# Patient Record
Sex: Female | Born: 1955
Health system: Southern US, Community
[De-identification: ages and names within clinical notes are randomized; demographics above are authoritative.]

## PROBLEM LIST (undated history)

## (undated) DIAGNOSIS — I10 Essential (primary) hypertension: Secondary | ICD-10-CM

## (undated) DIAGNOSIS — E119 Type 2 diabetes mellitus without complications: Secondary | ICD-10-CM

## (undated) DIAGNOSIS — E785 Hyperlipidemia, unspecified: Secondary | ICD-10-CM

## (undated) HISTORY — DX: Hyperlipidemia, unspecified: E78.5

---

## 2019-04-15 DIAGNOSIS — I2510 Atherosclerotic heart disease of native coronary artery without angina pectoris: Secondary | ICD-10-CM

## 2019-04-15 HISTORY — DX: Atherosclerotic heart disease of native coronary artery without angina pectoris: I25.10

## 2019-06-24 ENCOUNTER — Emergency Department (HOSPITAL_BASED_OUTPATIENT_CLINIC_OR_DEPARTMENT_OTHER): Payer: Medicare HMO

## 2019-06-24 ENCOUNTER — Other Ambulatory Visit: Payer: Self-pay

## 2019-06-24 ENCOUNTER — Inpatient Hospital Stay (HOSPITAL_BASED_OUTPATIENT_CLINIC_OR_DEPARTMENT_OTHER)
Admission: EM | Admit: 2019-06-24 | Discharge: 2019-06-28 | DRG: 246 | Disposition: A | Discharge status: Home / Self Care | Payer: Medicare HMO | Attending: Cardiology | Admitting: Cardiology

## 2019-06-24 ENCOUNTER — Encounter (HOSPITAL_BASED_OUTPATIENT_CLINIC_OR_DEPARTMENT_OTHER): Payer: Self-pay

## 2019-06-24 DIAGNOSIS — Z79899 Other long term (current) drug therapy: Secondary | ICD-10-CM | POA: Diagnosis not present

## 2019-06-24 DIAGNOSIS — Z823 Family history of stroke: Secondary | ICD-10-CM

## 2019-06-24 DIAGNOSIS — I255 Ischemic cardiomyopathy: Secondary | ICD-10-CM | POA: Diagnosis not present

## 2019-06-24 DIAGNOSIS — E785 Hyperlipidemia, unspecified: Secondary | ICD-10-CM | POA: Diagnosis present

## 2019-06-24 DIAGNOSIS — E119 Type 2 diabetes mellitus without complications: Secondary | ICD-10-CM | POA: Diagnosis not present

## 2019-06-24 DIAGNOSIS — Z7982 Long term (current) use of aspirin: Secondary | ICD-10-CM

## 2019-06-24 DIAGNOSIS — I214 Non-ST elevation (NSTEMI) myocardial infarction: Secondary | ICD-10-CM | POA: Diagnosis present

## 2019-06-24 DIAGNOSIS — I5041 Acute combined systolic (congestive) and diastolic (congestive) heart failure: Secondary | ICD-10-CM | POA: Diagnosis present

## 2019-06-24 DIAGNOSIS — I25110 Atherosclerotic heart disease of native coronary artery with unstable angina pectoris: Secondary | ICD-10-CM | POA: Diagnosis present

## 2019-06-24 DIAGNOSIS — Z20822 Contact with and (suspected) exposure to covid-19: Secondary | ICD-10-CM | POA: Diagnosis present

## 2019-06-24 DIAGNOSIS — I2510 Atherosclerotic heart disease of native coronary artery without angina pectoris: Secondary | ICD-10-CM

## 2019-06-24 DIAGNOSIS — I340 Nonrheumatic mitral (valve) insufficiency: Secondary | ICD-10-CM | POA: Diagnosis not present

## 2019-06-24 DIAGNOSIS — R252 Cramp and spasm: Secondary | ICD-10-CM | POA: Diagnosis present

## 2019-06-24 DIAGNOSIS — Z7984 Long term (current) use of oral hypoglycemic drugs: Secondary | ICD-10-CM | POA: Diagnosis not present

## 2019-06-24 DIAGNOSIS — I10 Essential (primary) hypertension: Secondary | ICD-10-CM | POA: Diagnosis not present

## 2019-06-24 DIAGNOSIS — I213 ST elevation (STEMI) myocardial infarction of unspecified site: Secondary | ICD-10-CM | POA: Diagnosis present

## 2019-06-24 DIAGNOSIS — I110 Hypertensive heart disease with heart failure: Secondary | ICD-10-CM | POA: Diagnosis present

## 2019-06-24 DIAGNOSIS — Z955 Presence of coronary angioplasty implant and graft: Secondary | ICD-10-CM

## 2019-06-24 DIAGNOSIS — E1165 Type 2 diabetes mellitus with hyperglycemia: Secondary | ICD-10-CM | POA: Diagnosis present

## 2019-06-24 DIAGNOSIS — E782 Mixed hyperlipidemia: Secondary | ICD-10-CM | POA: Diagnosis not present

## 2019-06-24 HISTORY — DX: Type 2 diabetes mellitus without complications: E11.9

## 2019-06-24 HISTORY — DX: Essential (primary) hypertension: I10

## 2019-06-24 LAB — CBC
HCT: 42.2 % (ref 36.0–46.0)
Hemoglobin: 13.6 g/dL (ref 12.0–15.0)
MCH: 28.3 pg (ref 26.0–34.0)
MCHC: 32.2 g/dL (ref 30.0–36.0)
MCV: 87.9 fL (ref 80.0–100.0)
Platelets: 314 10*3/uL (ref 150–400)
RBC: 4.8 MIL/uL (ref 3.87–5.11)
RDW: 12.6 % (ref 11.5–15.5)
WBC: 13.1 10*3/uL — ABNORMAL HIGH (ref 4.0–10.5)
nRBC: 0 % (ref 0.0–0.2)

## 2019-06-24 LAB — PROTIME-INR
INR: 0.9 (ref 0.8–1.2)
Prothrombin Time: 12.5 seconds (ref 11.4–15.2)

## 2019-06-24 LAB — BASIC METABOLIC PANEL
Anion gap: 12 (ref 5–15)
BUN: 13 mg/dL (ref 8–23)
CO2: 26 mmol/L (ref 22–32)
Calcium: 10.1 mg/dL (ref 8.9–10.3)
Chloride: 99 mmol/L (ref 98–111)
Creatinine, Ser: 0.79 mg/dL (ref 0.44–1.00)
GFR calc Af Amer: >60 mL/min (ref >60)
GFR calc non Af Amer: >60 mL/min (ref >60)
Glucose, Bld: 198 mg/dL — ABNORMAL HIGH (ref 70–99)
Potassium: 4.5 mmol/L (ref 3.5–5.1)
Sodium: 137 mmol/L (ref 135–145)

## 2019-06-24 LAB — TROPONIN I (HIGH SENSITIVITY)
Troponin I (High Sensitivity): 391 ng/L (ref <18)
Troponin I (High Sensitivity): 659 ng/L (ref <18)

## 2019-06-24 LAB — APTT: aPTT: 23 seconds — ABNORMAL LOW (ref 24–36)

## 2019-06-24 LAB — RESPIRATORY PANEL BY RT PCR (FLU A&B, COVID)
Influenza A by PCR: NEGATIVE
Influenza B by PCR: NEGATIVE
SARS Coronavirus 2 by RT PCR: NEGATIVE

## 2019-06-24 LAB — GLUCOSE, CAPILLARY
Glucose-Capillary: 167 mg/dL — ABNORMAL HIGH (ref 70–99)
Glucose-Capillary: 184 mg/dL — ABNORMAL HIGH (ref 70–99)

## 2019-06-24 MED ORDER — HEPARIN BOLUS VIA INFUSION
2900.0000 [IU] | Freq: Once | INTRAVENOUS | Status: AC
  Administered 2019-06-24: 2900 [IU] via INTRAVENOUS

## 2019-06-24 MED ORDER — SODIUM CHLORIDE 0.9% FLUSH
3.0000 mL | Freq: Once | INTRAVENOUS | Status: DC
  Filled 2019-06-24: qty 3

## 2019-06-24 MED ORDER — NITROGLYCERIN 0.4 MG SL SUBL: 0.4000 mg | SUBLINGUAL_TABLET | SUBLINGUAL | Status: DC | PRN

## 2019-06-24 MED ORDER — ASPIRIN EC 81 MG PO TBEC
81.0000 mg | DELAYED_RELEASE_TABLET | Freq: Every day | ORAL | Status: DC
  Administered 2019-06-25 – 2019-06-28 (×4): 81 mg via ORAL
  Filled 2019-06-24 (×4): qty 1

## 2019-06-24 MED ORDER — HEPARIN (PORCINE) 25000 UT/250ML-% IV SOLN
600.0000 [IU]/h | INTRAVENOUS | Status: DC
  Administered 2019-06-24 – 2019-06-26 (×2): 600 [IU]/h via INTRAVENOUS
  Filled 2019-06-24 (×2): qty 250

## 2019-06-24 MED ORDER — METOPROLOL TARTRATE 25 MG PO TABS
25.0000 mg | ORAL_TABLET | Freq: Two times a day (BID) | ORAL | Status: DC
  Administered 2019-06-24 – 2019-06-28 (×8): 25 mg via ORAL
  Filled 2019-06-24 (×8): qty 1

## 2019-06-24 MED ORDER — ONDANSETRON HCL 4 MG/2ML IJ SOLN: 4.0000 mg | Freq: Four times a day (QID) | INTRAMUSCULAR | Status: DC | PRN

## 2019-06-24 MED ORDER — IRBESARTAN 150 MG PO TABS
150.0000 mg | ORAL_TABLET | Freq: Every day | ORAL | Status: DC
  Administered 2019-06-25 – 2019-06-28 (×4): 150 mg via ORAL
  Filled 2019-06-24 (×4): qty 1

## 2019-06-24 MED ORDER — ATORVASTATIN CALCIUM 80 MG PO TABS
80.0000 mg | ORAL_TABLET | Freq: Every day | ORAL | Status: DC
  Administered 2019-06-24 – 2019-06-26 (×3): 80 mg via ORAL
  Filled 2019-06-24 (×2): qty 1
  Filled 2019-06-24: qty 2

## 2019-06-24 MED ORDER — ASPIRIN EC 325 MG PO TBEC
325.0000 mg | DELAYED_RELEASE_TABLET | Freq: Once | ORAL | Status: AC
  Administered 2019-06-24: 17:00:00 325 mg via ORAL
  Filled 2019-06-24: qty 1

## 2019-06-24 MED ORDER — INSULIN ASPART 100 UNIT/ML ~~LOC~~ SOLN
0.0000 [IU] | Freq: Three times a day (TID) | SUBCUTANEOUS | Status: DC
  Administered 2019-06-25: 18:00:00 2 [IU] via SUBCUTANEOUS
  Administered 2019-06-25: 14:00:00 7 [IU] via SUBCUTANEOUS
  Administered 2019-06-25: 10:00:00 1 [IU] via SUBCUTANEOUS
  Administered 2019-06-26: 17:00:00 5 [IU] via SUBCUTANEOUS
  Administered 2019-06-26 (×2): 2 [IU] via SUBCUTANEOUS

## 2019-06-24 MED ORDER — ACETAMINOPHEN 325 MG PO TABS: 650.0000 mg | ORAL_TABLET | ORAL | Status: DC | PRN

## 2019-06-24 NOTE — ED Provider Notes (Signed)
[facility] EMERGENCY DEPARTMENT Provider Note   CSN: [id number] Arrival date & time: 06/24/19  1515     History Chief Complaint  Patient presents with  . Chest Pain    Holly Scott is a 64 y.o. female with history of hypertension, diabetes, presented to the ED with chest pain and nausea.  The patient speaks [place], but her daughter in law at the bedside is able to translate.  Her daughter in law teaches ACLS and has some medical knowledge.  She tells me that the patient was having chest pain and epigastric discomfort yesterday, though she did not complain about any family members.  Had a difficult time describing other than describe burning sensation in her chest.  This pain seemed to come and go.  It began around 11 AM yesterday, 1 to 2 hours after she had eaten breakfast.  It was not related to activity, and the patient had never had pain like that before.  Today she went to the dentist this morning for tooth cleaning.  She returned home, and complained to her daughter about 1 hour ago of chest pain and nausea.  She vomited once 1 hour ago and her chest pain has completely resolved.  She feels back to normal.  She has regular daily bowel movements, including today.  No diarhrea or constipation.  She got both covid vaccine doses, most recently 3 weeks ago.  She denies fevers, chills, cough, congestion or SOB recently.  No hx of abdominal surgery No hx of cardiac disease, MI, stents, angina, smoking, or family hx of cardiac disease.  HPI     Past Medical History:  Diagnosis Date  . Diabetes mellitus without complication (HCC)   . Hypertension     Patient Active Problem List   Diagnosis Date Noted  . NSTEMI (non-ST elevated myocardial infarction) (HCC) 06/24/2019    History reviewed. No pertinent surgical history.   OB History   No obstetric history on file.     No family history on file.  Social History   Tobacco Use  . Smoking status: Never  Smoker  . Smokeless tobacco: Never Used  Substance Use Topics  . Alcohol use: Never  . Drug use: Never    Home Medications Prior to Admission medications   Not on File    Allergies    Patient has no known allergies.  Review of Systems   Review of Systems  Constitutional: Negative for chills and fever.  Respiratory: Negative for cough and shortness of breath.   Cardiovascular: Positive for chest pain. Negative for palpitations.  Gastrointestinal: Positive for nausea and vomiting. Negative for abdominal pain and constipation.  Skin: Negative for pallor and rash.  Psychiatric/Behavioral: Negative for agitation and confusion.  All other systems reviewed and are negative.   Physical Exam Updated Vital Signs BP 128/71   Pulse 86   Temp 98 F (36.7 C) (Oral)   Resp 19   Ht 5' (1.524 m)   Wt 48.1 kg   SpO2 98%   BMI 20.70 kg/m   Physical Exam Vitals and nursing note reviewed.  Constitutional:      General: She is not in acute distress.    Appearance: She is well-developed.  HENT:     Head: Normocephalic and atraumatic.  Eyes:     Conjunctiva/sclera: Conjunctivae normal.  Cardiovascular:     Rate and Rhythm: Normal rate and regular rhythm.     Heart sounds: No murmur.  Pulmonary:     Effort: Pulmonary  effort is normal. No respiratory distress.     Breath sounds: Normal breath sounds.  Abdominal:     Palpations: Abdomen is soft.     Tenderness: There is no abdominal tenderness. There is no guarding or rebound. Negative signs include Murphy's sign and McBurney's sign.  Musculoskeletal:     Cervical back: Neck supple.  Skin:    General: Skin is warm and dry.  Neurological:     Mental Status: She is alert.  Psychiatric:        Mood and Affect: Mood normal.        Behavior: Behavior normal.     ED Results / Procedures / Treatments   Labs (all labs ordered are listed, but only abnormal results are displayed) Labs Reviewed  BASIC METABOLIC PANEL - Abnormal;  Notable for the following components:      Result Value   Glucose, Bld 198 (*)    All other components within normal limits  CBC - Abnormal; Notable for the following components:   WBC 13.1 (*)    All other components within normal limits  APTT - Abnormal; Notable for the following components:   aPTT 23 (*)    All other components within normal limits  TROPONIN I (HIGH SENSITIVITY) - Abnormal; Notable for the following components:   Troponin I (High Sensitivity) 391 (*)    All other components within normal limits  RESPIRATORY PANEL BY RT PCR (FLU A&B, COVID)  PROTIME-INR  TROPONIN I (HIGH SENSITIVITY)    EKG EKG Interpretation  Date/Time:  Friday June 24 2019 16:32:10 EST Ventricular Rate:  84 PR Interval:    QRS Duration: 67 QT Interval:  367 QTC Calculation: 434 R Axis:   62 Text Interpretation: Sinus rhythm Anteroseptal infarct, old No STEMI, improvement of ST elevation in V1 from arrival Confirmed by Octaviano Glow [phone]) on 06/24/2019 4:35:44 PM   Radiology DG Chest 2 View  Result Date: 06/24/2019 CLINICAL DATA:  Chest pain. Pain for 2 days. EXAM: CHEST - 2 VIEW COMPARISON:  None. FINDINGS: The cardiomediastinal contours are normal. The lungs are clear. Pulmonary vasculature is normal. No consolidation, pleural effusion, or pneumothorax. No acute osseous abnormalities are seen. IMPRESSION: Negative radiographs of the chest. Electronically Signed   By: Keith Rake M.D.   On: 06/24/2019 16:04    Procedures .Critical Care Performed by: Wyvonnia Dusky, MD Authorized by: Wyvonnia Dusky, MD   Critical care provider statement:    Critical care time (minutes):  52   Critical care was necessary to treat or prevent imminent or life-threatening deterioration of the following conditions:  Circulatory failure   Critical care was time spent personally by me on the following activities:  Discussions with consultants, evaluation of patient's response to treatment,  examination of patient, ordering and performing treatments and interventions, ordering and review of laboratory studies, ordering and review of radiographic studies, pulse oximetry, re-evaluation of patient's condition, obtaining history from patient or surrogate and review of old charts Comments:     NSTEMI requiring IV heparin, repeat ECG, discussion with consultant   (including critical care time)  Medications Ordered in ED Medications  sodium chloride flush (NS) 0.9 % injection 3 mL (3 mLs Intravenous Not Given 06/24/19 1542)  atorvastatin (LIPITOR) tablet 80 mg (80 mg Oral Given 06/24/19 1646)  heparin ADULT infusion 100 units/mL (25000 units/216mL sodium chloride 0.45%) (600 Units/hr Intravenous Transfusing/Transfer 06/24/19 1738)  aspirin EC tablet 325 mg (325 mg Oral Given 06/24/19 1648)  heparin bolus via  infusion 2,900 Units (2,900 Units Intravenous Bolus from Bag 06/24/19 1652)    ED Course  I have reviewed the triage vital signs and the nursing notes.  Pertinent labs & imaging results that were available during my care of the patient were reviewed by me and considered in my medical decision making (see chart for details).  64 year old female presented to the emergency department with ongoing epigastric pain and discomfort for the past 2 days, most recently experienced a flare approximately 1 hour prior to arrival, which resolved completely after vomiting once.  She says it felt like a burning sensation in her chest.  She has no prior history of reflux.    She is currently asymptomatic in the ED.  Her EKG on arrival per my interpretation is normal sinus rhythm.  There is mild ST elevation in V1, but no reciprocal changes any of the other leads to suggest ACS.  V2 elevation consistent with BEL.  Does not appear to be Wellen's pattern.  We'll need troponin level, BMP, CBC, and ecg.  No abdominal ttp to suggest acute cholecystitis.  This may be biliary colic, can't exclude the possibility.   Less likely pancreatitis with no risk factors or ongoing symptoms.  Less likely SBO with regular bowel movements and a benign abdominal exam.    Less likely PE with no tachypnea, tachycardia, or risk factors.  Clinical Course as of Jun 24 1807  Fri Jun 24, 2019  1634 Patient remains pain free.  Will give aspirin, start heparin.  Repeat ECG without new or worsening elevations. Will discuss with cardiology regarding management of NSTEMI.  Patient and family member updated.  She will need transfer to [facility]   [MT]  1641 Gone by [facility]   [MT]  1646 Spoke to Dr Anne Fu at [facility] who accepted the patient for [facility] bed, agreed with heparin infusion.  Patient remains pain free, we'll continue to monitor her symptoms while she's here   [MT]    Clinical Course User Index [MT] Lelend Heinecke, Kermit Balo, MD    Final Clinical Impression(s) / ED Diagnoses Final diagnoses:  NSTEMI (non-ST elevated myocardial infarction) [facility])    Rx / DC Orders ED Discharge Orders    None       Terald Sleeper, MD 06/24/19 [phone]

## 2019-06-24 NOTE — ED Notes (Signed)
Attempted to call report; this RN contact info provided for callback 

## 2019-06-24 NOTE — Progress Notes (Signed)
Patient admitted from [facility]. Transferred by ambulance. Resting comfortably in bed. Initial assessment completed. Unable to speak [place]. Daughter parking car and will be into interpret. Denies pain. Report to Lethea Killings RN. Marjie Skiff cardiac PA into see patient.

## 2019-06-24 NOTE — ED Triage Notes (Addendum)
Per daughter in law/interpreter-pt with CP day 2- n/v today-denies fever/cough-NAD-to tx room via w/c-CP was 8/10 pt vomited then CP 2/10-pt unable to describe pain except to state "feels like a heart attack" per daughter in law

## 2019-06-24 NOTE — Progress Notes (Signed)
ANTICOAGULATION CONSULT NOTE - Initial Consult  Pharmacy Consult for heparin Indication: chest pain/ACS  No Known Allergies  Patient Measurements: Height: 5' (152.4 cm) Weight: 106 lb (48.1 kg) IBW/kg (Calculated) : 45.5 Heparin Dosing Weight: 48  Vital Signs: Temp: 98 F (36.7 C) (03/12 1525) Temp Source: Oral (03/12 1525) BP: 131/67 (03/12 1630) Pulse Rate: 80 (03/12 1630)  Labs: Recent Labs    06/24/19 1528  HGB 13.6  HCT 42.2  PLT 314  CREATININE 0.79  TROPONINIHS 391*    Estimated Creatinine Clearance: 50.4 mL/min (by C-G formula based on SCr of 0.79 mg/dL).   Medical History: Past Medical History:  Diagnosis Date  . Diabetes mellitus without complication (HCC)   . Hypertension     Medications:  Scheduled:  . aspirin EC  325 mg Oral Once  . atorvastatin  80 mg Oral q1800  . heparin  2,900 Units Intravenous Once  . sodium chloride flush  3 mL Intravenous Once    Assessment:  Goal of Therapy:  Heparin level 0.3-0.7 units/ml Monitor platelets by anticoagulation protocol: Yes   Plan:  Give 2900 units bolus x 1 Start heparin infusion at 600 units/hr Check anti-Xa level in 8 hours and daily while on heparin Continue to monitor H&H and platelets  Sherilyn Cooter A Betsy Rosello 06/24/2019,4:43 PM

## 2019-06-24 NOTE — H&P (Signed)
Cardiology Admission History and Physical:   Patient ID: Holly Scott MRN: [id number]; DOB: [birth year]   Admission date: 06/24/2019  Primary Care Provider: Patient, No Pcp Per Primary Cardiologist: New to [facility] Primary Electrophysiologist:  None   Chief Complaint:  Chest Pain  Patient Profile:   Holly Scott is a 64 y.o. female with a history of hypertension and type 2 diabetes mellitus who presented to [facility] ED with chest pain and was found to NSTEMI. Transferred to [facility] for left heart catheterization on Monday.   History of Present Illness:   Holly Scott is a 63 year old female with a history of hypertension and type 2 diabetes mellitus. No known cardiac history and no prior cardiac work-up. Patient is followed at [facility] for primary care. She also sees Dr. Allena Katz (Endocrinology) at [facility] for management of her diabetes. Current home medications include Aspirin 81mg  daily, Telmisartan 40mg  daily, Glimepiride 2mg  daily, and Metformin 1000mg  twice daily. BP has been elevated recently and PCP increased Telmisartan from 20mg  to 40mg  daily at last office visit about 1 month ago. She has no smoking history. No known family history of heart disease but father did have a stroke in his 33's. She stays very active taking care of her large family but does not sound like any formal exercise.   Patient presented to the [facility] ED today for further evaluation of chest pain. Patient reports intermittent substernal non-radiating chest pain that she describes as a burning since yesterday. Pain comes and goes lasting for about 5-10 minutes at a time before resolving on her home. Pain was worse with activity today and she did vomit once but no associated shortness of breath or diaphoresis. Patient did not tell her family until today when pain became very severe. Family called 911 at this time. No palpitations, dizziness, or syncope. No orthopnea, PND,  or edema. A few nights ago, patient states she had significant anterior leg pain at night that kept her from sleep. She occasionally has cramps in her calves at night but this was different. However, this was only an isolated event. She denies any claudication. No recent fevers or illnesses. No abdominal pain. She did go to the dentist today and had bleeding with a deep cleaning but no other abnormal bleeding. No hematochezia, melena, or hematuria.   In the ED, vitals stable. EKG showed normal sinus rhythm, rate 87 bpm, with minimal (<66mm) ST elevation in V1-V2 but did not meet criteria for STEMI. These changes improved on repeat EKG. Initial high-sensitivity troponin elevated at 391. Repeat pending. Chest x-ray showed no acute findings. WBC 13.1, Hgb 13.6, Plts 314. Na 137, K 4.5, Glucose 198, BUN 13, Cr 0.79. COVID-19 negative. She was started on IV Heparin and given Aspirin 325mg  in the ED. Transferred to for heart catheterization on Monday.   At the time of this evaluation, patient resting comfortably. Currently chest pain free.   Of note, patient does not speak . Holly Scott) was present for evaluation and served as interpreter.   Past Medical History:  Diagnosis Date  . Diabetes mellitus without complication (HCC)   . Hypertension     History reviewed. No pertinent surgical history.   Medications Prior to Admission: Prior to Admission medications   Not on File     Allergies:   No Known Allergies  Social History:   Social History   Socioeconomic History  . Marital status: Married    Spouse name:  Not on file  . Number of children: Not on file  . Years of education: Not on file  . Highest education level: Not on file  Occupational History  . Not on file  Tobacco Use  . Smoking status: Never Smoker  . Smokeless tobacco: Never Used  Substance and Sexual Activity  . Alcohol use: Never  . Drug use: Never  . Sexual activity: Not on file  Other  Topics Concern  . Not on file  Social History Narrative  . Not on file   Social Determinants of Health   Financial Resource Strain:   . Difficulty of Paying Living Expenses:   Food Insecurity:   . Worried About Programme researcher, broadcasting/film/video in the Last Year:   . Barista in the Last Year:   Transportation Needs:   . Freight forwarder (Medical):   Marland Kitchen Lack of Transportation (Non-Medical):   Physical Activity:   . Days of Exercise per Week:   . Minutes of Exercise per Session:   Stress:   . Feeling of Stress :   Social Connections:   . Frequency of Communication with Friends and Family:   . Frequency of Social Gatherings with Friends and Family:   . Attends Religious Services:   . Active Member of Clubs or Organizations:   . Attends Banker Meetings:   Marland Kitchen Marital Status:   Intimate Partner Violence:   . Fear of Current or Ex-Partner:   . Emotionally Abused:   Marland Kitchen Physically Abused:   . Sexually Abused:     Family History:   The patient's family history includes Stroke (age of onset: 64) in her father. There is no history of Heart disease.    ROS:  Please see the history of present illness.  Review of Systems  Constitutional: Negative for fever.  HENT: Negative for congestion.   Respiratory: Negative for cough, hemoptysis and shortness of breath.   Cardiovascular: Positive for chest pain. Negative for palpitations, orthopnea, claudication, leg swelling and PND.  Gastrointestinal: Positive for vomiting. Negative for abdominal pain, blood in stool and melena.  Genitourinary: Negative for flank pain.  Musculoskeletal: Negative for myalgias.       Anterior leg pain  Neurological: Negative for dizziness and loss of consciousness.  Endo/Heme/Allergies: Does not bruise/bleed easily.  Psychiatric/Behavioral: Negative for substance abuse.     Physical Exam/Data:   Vitals:   06/24/19 1730 06/24/19 1844 06/24/19 1845 06/24/19 1845  BP: 128/71 (!) 119/54  (!) 119/54   Pulse: 86 80  91  Resp: 19     Temp:  100.2 F (37.9 C)  100.2 F (37.9 C)  TempSrc:  Oral  Oral  SpO2: 98% 99%  98%  Weight:   46.4 kg   Height:   5' (1.524 m)    No intake or output data in the 24 hours ending 06/24/19 1938 Last 3 Weights 06/24/2019 06/24/2019  Weight (lbs) 102 lb 4.8 oz 106 lb  Weight (kg) 46.403 kg 48.081 kg     Body mass index is 19.98 kg/m.  General: 64 y.o. female resting comfortably in no acute distress.  HEENT: Normocephalic and atraumatic. Sclera clear.  Neck: Supple. No carotid bruits. No JVD. Heart: RRR. Distinct S1 and S2. No murmurs, gallops, or rubs. Radial and distal pedal pulses 2+ and equal bilaterally. Lungs: No increased work of breathing. Clear to ausculation bilaterally. No wheezes, rhonchi, or rales.  Abdomen: Soft, non-distended, and non-tender to palpation. Bowel sounds present. Extremities:  No lower extremity edema.    Skin: Warm and dry. Neuro: Alert and oriented x3. No focal deficits. Psych: Normal affect. Responds appropriately.  EKG:  The ECG that was done was personally reviewed and demonstrates normal sinus rhythm, rate 87 bpm, with minimal ST elevation in V1 and V2 but not consistent with STEMI. Repeat EKG possible Q waves in V1-V2 with improvement in ST changes.   Telemetry: Telemetry personally reviewed and demonstrates normal sinus rhythm with rates in the 90's.  Relevant CV Studies:  None.  Laboratory Data:  High Sensitivity Troponin:   Recent Labs  Lab 06/24/19 1528 06/24/19 1738  TROPONINIHS 391* 659*      Chemistry Recent Labs  Lab 06/24/19 1528  NA 137  K 4.5  CL 99  CO2 26  GLUCOSE 198*  BUN 13  CREATININE 0.79  CALCIUM 10.1  GFRNONAA >60  GFRAA >60  ANIONGAP 12    No results for input(s): PROT, ALBUMIN, AST, ALT, ALKPHOS, BILITOT in the last 168 hours. Hematology Recent Labs  Lab 06/24/19 1528  WBC 13.1*  RBC 4.80  HGB 13.6  HCT 42.2  MCV 87.9  MCH 28.3  MCHC 32.2  RDW 12.6  PLT 314     BNPNo results for input(s): BNP, PROBNP in the last 168 hours.  DDimer No results for input(s): DDIMER in the last 168 hours.   Radiology/Studies:  DG Chest 2 View  Result Date: 06/24/2019 CLINICAL DATA:  Chest pain. Pain for 2 days. EXAM: CHEST - 2 VIEW COMPARISON:  None. FINDINGS: The cardiomediastinal contours are normal. The lungs are clear. Pulmonary vasculature is normal. No consolidation, pleural effusion, or pneumothorax. No acute osseous abnormalities are seen. IMPRESSION: Negative radiographs of the chest. Electronically Signed   By: Narda Rutherford M.D.   On: 06/24/2019 16:04   TIMI Risk Score for Unstable Angina or Non-ST Elevation MI:   The patient's TIMI risk score is 5, which indicates a 26% risk of all cause mortality, new or recurrent myocardial infarction or need for urgent revascularization in the next 14 days.   Assessment and Plan:   NSTEMI - Patient presented with chest pain.  - High-sensitivity troponin elevated at 391 > 659. - Initial EKG showed minimal ST elevation in V1 and V2 but did not meet criteria for STEMI. ST changes improved on repeat tracing. - Will check Echo. - Will check fasting lipid panel and hemoglobin A1c.  - Continue IV Heparin.  - Continue Aspirin 81mg  daily. Will start Lopressor 25mg  twice daily and Lipitor 80mg  daily. - Will need left heart catheterization on Monday. 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   Hypertension - BP currently well controlled. - Continue ARB. Patient on Telmisartan 40mg  daily at home but do not have this on formulary here. Will use Irbesartan 150mg  daily instead.   Type 2 Diabetes Mellitus - Will check hemoglobin A1c. - On Glimepiride and Metformin at home. Will hold these and start on sliding scale insulin.   Holly Scott) will be present throughout the weekend to help with  interpreting. However, if she is not here, she can be reached at [phone].  Severity of Illness: The appropriate patient status for this patient is INPATIENT. Inpatient status is judged to be reasonable and necessary in order to provide the required intensity of service to ensure the patient's safety. The patient's presenting symptoms, physical exam  findings, and initial radiographic and laboratory data in the context of their chronic comorbidities is felt to place them at high risk for further clinical deterioration. Furthermore, it is not anticipated that the patient will be medically stable for discharge from the hospital within 2 midnights of admission. The following factors support the patient status of inpatient.   " The patient's presenting symptoms include chest pain. " The worrisome physical exam as above. " The initial radiographic and laboratory data are worrisome because of elevated troponin .  " The chronic co-morbidities include hypertension and diabetes.   * I certify that at the point of admission it is my clinical judgment that the patient will require inpatient hospital care spanning beyond 2 midnights from the point of admission due to high intensity of service, high risk for further deterioration and high frequency of surveillance required.*    For questions or updates, please contact [place] Please consult [email or web address] for contact info under        Signed, Darreld Mclean, PA-C  06/24/2019 7:38 PM

## 2019-06-25 ENCOUNTER — Inpatient Hospital Stay (HOSPITAL_COMMUNITY): Payer: Medicare HMO

## 2019-06-25 DIAGNOSIS — I340 Nonrheumatic mitral (valve) insufficiency: Secondary | ICD-10-CM

## 2019-06-25 DIAGNOSIS — E782 Mixed hyperlipidemia: Secondary | ICD-10-CM

## 2019-06-25 LAB — TROPONIN I (HIGH SENSITIVITY)
Troponin I (High Sensitivity): 3107 ng/L (ref <18)
Troponin I (High Sensitivity): 2659 ng/L (ref <18)

## 2019-06-25 LAB — BASIC METABOLIC PANEL
Anion gap: 10 (ref 5–15)
BUN: 10 mg/dL (ref 8–23)
CO2: 28 mmol/L (ref 22–32)
Calcium: 9.5 mg/dL (ref 8.9–10.3)
Chloride: 102 mmol/L (ref 98–111)
Creatinine, Ser: 0.73 mg/dL (ref 0.44–1.00)
GFR calc Af Amer: >60 mL/min (ref >60)
GFR calc non Af Amer: >60 mL/min (ref >60)
Glucose, Bld: 215 mg/dL — ABNORMAL HIGH (ref 70–99)
Potassium: 4.3 mmol/L (ref 3.5–5.1)
Sodium: 140 mmol/L (ref 135–145)

## 2019-06-25 LAB — LIPID PANEL
Cholesterol: 178 mg/dL (ref 0–200)
HDL: 60 mg/dL (ref >40)
LDL Cholesterol: 97 mg/dL (ref 0–99)
Total CHOL/HDL Ratio: 3 RATIO
Triglycerides: 103 mg/dL (ref <150)
VLDL: 21 mg/dL (ref 0–40)

## 2019-06-25 LAB — CBC
HCT: 40.2 % (ref 36.0–46.0)
Hemoglobin: 12.7 g/dL (ref 12.0–15.0)
MCH: 27.4 pg (ref 26.0–34.0)
MCHC: 31.6 g/dL (ref 30.0–36.0)
MCV: 86.8 fL (ref 80.0–100.0)
Platelets: 291 10*3/uL (ref 150–400)
RBC: 4.63 MIL/uL (ref 3.87–5.11)
RDW: 12.6 % (ref 11.5–15.5)
WBC: 10.1 10*3/uL (ref 4.0–10.5)
nRBC: 0 % (ref 0.0–0.2)

## 2019-06-25 LAB — GLUCOSE, CAPILLARY
Glucose-Capillary: 162 mg/dL — ABNORMAL HIGH (ref 70–99)
Glucose-Capillary: 235 mg/dL — ABNORMAL HIGH (ref 70–99)
Glucose-Capillary: 319 mg/dL — ABNORMAL HIGH (ref 70–99)
Glucose-Capillary: 186 mg/dL — ABNORMAL HIGH (ref 70–99)
Glucose-Capillary: 240 mg/dL — ABNORMAL HIGH (ref 70–99)

## 2019-06-25 LAB — HEMOGLOBIN A1C
Hgb A1c MFr Bld: 9.3 % — ABNORMAL HIGH (ref 4.8–5.6)
Mean Plasma Glucose: 220.21 mg/dL

## 2019-06-25 LAB — HIV ANTIBODY (ROUTINE TESTING W REFLEX): HIV Screen 4th Generation wRfx: NONREACTIVE

## 2019-06-25 LAB — HEPARIN LEVEL (UNFRACTIONATED)
Heparin Unfractionated: 0.45 IU/mL (ref 0.30–0.70)
Heparin Unfractionated: 0.51 IU/mL (ref 0.30–0.70)

## 2019-06-25 LAB — ECHOCARDIOGRAM COMPLETE
Height: 60 in
Weight: 1649.6 oz

## 2019-06-25 MED ORDER — PROSIGHT PO TABS
1.0000 | ORAL_TABLET | Freq: Every day | ORAL | Status: DC
  Administered 2019-06-25 – 2019-06-28 (×4): 1 via ORAL
  Filled 2019-06-25 (×4): qty 1

## 2019-06-25 NOTE — Progress Notes (Signed)
ANTICOAGULATION CONSULT NOTE - Follow Up Consult  Pharmacy Consult for Heparin Indication: chest pain/ACS  No Known Allergies  Patient Measurements: Height: 5' (152.4 cm) Weight: 103 lb 1.6 oz (46.8 kg) IBW/kg (Calculated) : 45.5 Heparin Dosing Weight: 46.8kg  Vital Signs: Temp: 98.1 F (36.7 C) (03/13 0558) Temp Source: Oral (03/13 0558) BP: 121/62 (03/13 0931) Pulse Rate: 73 (03/13 0931)  Labs: Recent Labs    06/24/19 1528 06/24/19 1738 06/25/19 0053 06/25/19 0950  HGB 13.6  --  12.7  --   HCT 42.2  --  40.2  --   PLT 314  --  291  --   APTT 23*  --   --   --   LABPROT 12.5  --   --   --   INR 0.9  --   --   --   HEPARINUNFRC  --   --  0.45 0.51  CREATININE 0.79  --  0.73  --   TROPONINIHS 391* 659*  --   --     Estimated Creatinine Clearance: 50.4 mL/min (by C-G formula based on SCr of 0.73 mg/dL).  Assessment:  Anticoag: Heparin for ACS started at [facility]. HL 0.45 and 0.51 both in goal range. Hgb 12.7. Plts 291 WNL.   Goal of Therapy:  Heparin level 0.3-0.7 units/ml Monitor platelets by anticoagulation protocol: Yes   Plan:  Cont Heparin at 600 units/hr Daily HL and CBC   Cleotha Tsang S. Merilynn Finland, PharmD, BCPS Clinical Staff Pharmacist [email or web address] Merilynn Finland, [employer] 06/25/2019,10:38 AM

## 2019-06-25 NOTE — Progress Notes (Addendum)
Progress Note  Patient Name: Holly Scott Date of Encounter: 06/25/2019  Primary Cardiologist: Dr. Mayford Knife  Subjective   Denies any CP or SOB since the admission.  Inpatient Medications    Scheduled Meds: . aspirin EC  81 mg Oral Daily  . atorvastatin  80 mg Oral q1800  . insulin aspart  0-9 Units Subcutaneous TID WC  . irbesartan  150 mg Oral Daily  . metoprolol tartrate  25 mg Oral BID  . sodium chloride flush  3 mL Intravenous Once   Continuous Infusions: . heparin 600 Units/hr (06/24/19 1845)   PRN Meds: acetaminophen, nitroGLYCERIN, ondansetron (ZOFRAN) IV   Vital Signs    Vitals:   06/24/19 1845 06/24/19 2003 06/24/19 2226 06/25/19 0558  BP: (!) 119/54 130/70  123/76  Pulse: 91 88 88 76  Resp:  20    Temp: 100.2 F (37.9 C) 98.9 F (37.2 C)  98.1 F (36.7 C)  TempSrc: Oral Oral  Oral  SpO2: 98% 98%  100%  Weight:    46.8 kg  Height:        Intake/Output Summary (Last 24 hours) at 06/25/2019 0720 Last data filed at 06/25/2019 0655 Gross per 24 hour  Intake 353.11 ml  Output --  Net 353.11 ml   Last 3 Weights 06/25/2019 06/24/2019 06/24/2019  Weight (lbs) 103 lb 1.6 oz 102 lb 4.8 oz 106 lb  Weight (kg) 46.766 kg 46.403 kg 48.081 kg      Telemetry    NSR without significant ventricular ectopy - Personally Reviewed  ECG    NSR with questionable q wave in the septal leads - Personally Reviewed  Physical Exam   GEN: No acute distress.   Neck: No JVD Cardiac: RRR, no murmurs, rubs, or gallops.  Respiratory: Clear to auscultation bilaterally. GI: Soft, nontender, non-distended  MS: No edema; No deformity. Neuro:  Nonfocal  Psych: Normal affect   Labs    High Sensitivity Troponin:   Recent Labs  Lab 06/24/19 1528 06/24/19 1738  TROPONINIHS 391* 659*      Chemistry Recent Labs  Lab 06/24/19 1528 06/25/19 0053  NA 137 140  K 4.5 4.3  CL 99 102  CO2 26 28  GLUCOSE 198* 215*  BUN 13 10  CREATININE 0.79 0.73  CALCIUM 10.1  9.5  GFRNONAA >60 >60  GFRAA >60 >60  ANIONGAP 12 10     Hematology Recent Labs  Lab 06/24/19 1528 06/25/19 0053  WBC 13.1* 10.1  RBC 4.80 4.63  HGB 13.6 12.7  HCT 42.2 40.2  MCV 87.9 86.8  MCH 28.3 27.4  MCHC 32.2 31.6  RDW 12.6 12.6  PLT 314 291    BNPNo results for input(s): BNP, PROBNP in the last 168 hours.   DDimer No results for input(s): DDIMER in the last 168 hours.   Radiology    DG Chest 2 View  Result Date: 06/24/2019 CLINICAL DATA:  Chest pain. Pain for 2 days. EXAM: CHEST - 2 VIEW COMPARISON:  None. FINDINGS: The cardiomediastinal contours are normal. The lungs are clear. Pulmonary vasculature is normal. No consolidation, pleural effusion, or pneumothorax. No acute osseous abnormalities are seen. IMPRESSION: Negative radiographs of the chest. Electronically Signed   By: Narda Rutherford M.D.   On: 06/24/2019 16:04    Cardiac Studies   pending  Patient Profile     64 y.o. female with PMH of HTN and DM II with no prior h/o CAD presented to [facility] with chest pain and  ruled in for NSTEMI.  Assessment & Plan    1. NSTEMI  - hs Trop 391 --> 659, no new labs, I will order  - we will continue iv heparin  - Initial EKG showed minimal ST elevation in V1-V2, ?q wave in septal leads. EKG does not meet STEMI criteria. Subsequent EKG showed improved in ST elevation, but continue to show ?q wave in septal leads, we will repeat  - pending echocardiogram  - plan for cardiac catheterization on Monday  - Risk and benefit of procedure explained to the patient who display clear understanding and agree to proceed.Discussed with patient possible procedural risk include bleeding, vascular injury, renal injury, arrythmia, MI, stroke and loss of limb or life.  - total cholesterol, HDL and triglyceride at goal. LDL 97, goal is <70. Continue high dose lipitor.  2. HTN: on Telmisartan at home, temporarily substituted for irbesartan in the hospital. Metoprolol added.   3.  DM II: uncontrolled with hemoglobin A1C 9.3  4. Leg cramps: present before admission, worse during this admission. K is normal. Will continue lipitor, if leg cramps continue to worsen, may need to switch statin.  For questions or updates, please contact [place] Please consult [email or web address] for contact info under        Signed, Almyra Deforest, [place]  06/25/2019, 7:20 AM

## 2019-06-25 NOTE — Progress Notes (Signed)
  Echocardiogram 2D Echocardiogram has been performed.  Janalyn Harder 06/25/2019, 2:57 PM

## 2019-06-25 NOTE — Progress Notes (Signed)
ANTICOAGULATION CONSULT NOTE  Pharmacy Consult for Heparin Indication: chest pain/ACS  No Known Allergies  Patient Measurements: Height: 5' (152.4 cm) Weight: 102 lb 4.8 oz (46.4 kg) IBW/kg (Calculated) : 45.5 Heparin Dosing Weight: 48  Vital Signs: Temp: 98.9 F (37.2 C) (03/12 2003) Temp Source: Oral (03/12 2003) BP: 130/70 (03/12 2003) Pulse Rate: 88 (03/12 2226)  Labs: Recent Labs    06/24/19 1528 06/24/19 1738 06/25/19 0053  HGB 13.6  --  12.7  HCT 42.2  --  40.2  PLT 314  --  291  APTT 23*  --   --   LABPROT 12.5  --   --   INR 0.9  --   --   HEPARINUNFRC  --   --  0.45  CREATININE 0.79  --  0.73  TROPONINIHS 391* 659*  --     Estimated Creatinine Clearance: 50.4 mL/min (by C-G formula based on SCr of 0.73 mg/dL).   Medical History: Past Medical History:  Diagnosis Date  . Diabetes mellitus without complication (HCC)   . Hypertension     Medications:  Scheduled:  . aspirin EC  81 mg Oral Daily  . atorvastatin  80 mg Oral q1800  . insulin aspart  0-9 Units Subcutaneous TID WC  . irbesartan  150 mg Oral Daily  . metoprolol tartrate  25 mg Oral BID  . sodium chloride flush  3 mL Intravenous Once    Assessment: 64 y/o F on heparin for CP Initial heparin level is therapeutic  CBC ok  Goal of Therapy:  Heparin level 0.3-0.7 units/ml Monitor platelets by anticoagulation protocol: Yes   Plan Cont heparin at 600 units/hr 1000 heparin level  Abran Duke, PharmD, BCPS Clinical Pharmacist Phone: [phone]

## 2019-06-26 ENCOUNTER — Other Ambulatory Visit (HOSPITAL_COMMUNITY): Payer: Medicare HMO

## 2019-06-26 DIAGNOSIS — I255 Ischemic cardiomyopathy: Secondary | ICD-10-CM

## 2019-06-26 LAB — CBC
HCT: 41.7 % (ref 36.0–46.0)
Hemoglobin: 13.1 g/dL (ref 12.0–15.0)
MCH: 27.9 pg (ref 26.0–34.0)
MCHC: 31.4 g/dL (ref 30.0–36.0)
MCV: 88.9 fL (ref 80.0–100.0)
Platelets: 289 10*3/uL (ref 150–400)
RBC: 4.69 MIL/uL (ref 3.87–5.11)
RDW: 12.5 % (ref 11.5–15.5)
WBC: 8.2 10*3/uL (ref 4.0–10.5)
nRBC: 0 % (ref 0.0–0.2)

## 2019-06-26 LAB — GLUCOSE, CAPILLARY
Glucose-Capillary: 181 mg/dL — ABNORMAL HIGH (ref 70–99)
Glucose-Capillary: 199 mg/dL — ABNORMAL HIGH (ref 70–99)
Glucose-Capillary: 260 mg/dL — ABNORMAL HIGH (ref 70–99)
Glucose-Capillary: 342 mg/dL — ABNORMAL HIGH (ref 70–99)

## 2019-06-26 LAB — HEPARIN LEVEL (UNFRACTIONATED): Heparin Unfractionated: 0.51 IU/mL (ref 0.30–0.70)

## 2019-06-26 MED ORDER — INSULIN ASPART 100 UNIT/ML ~~LOC~~ SOLN
0.0000 [IU] | Freq: Every day | SUBCUTANEOUS | Status: DC
  Administered 2019-06-26: 22:00:00 4 [IU] via SUBCUTANEOUS
  Administered 2019-06-27: 22:00:00 3 [IU] via SUBCUTANEOUS

## 2019-06-26 MED ORDER — ASPIRIN 81 MG PO CHEW
81.0000 mg | CHEWABLE_TABLET | ORAL | Status: AC
  Administered 2019-06-27: 05:00:00 81 mg via ORAL
  Filled 2019-06-26: qty 1

## 2019-06-26 MED ORDER — SODIUM CHLORIDE 0.9% FLUSH: 3.0000 mL | INTRAVENOUS | Status: DC | PRN

## 2019-06-26 MED ORDER — SODIUM CHLORIDE 0.9% FLUSH
3.0000 mL | Freq: Two times a day (BID) | INTRAVENOUS | Status: DC
  Administered 2019-06-26 – 2019-06-27 (×2): 3 mL via INTRAVENOUS

## 2019-06-26 MED ORDER — SODIUM CHLORIDE 0.9 % IV SOLN: INTRAVENOUS | Status: DC

## 2019-06-26 MED ORDER — SODIUM CHLORIDE 0.9 % IV SOLN: 250.0000 mL | INTRAVENOUS | Status: DC | PRN

## 2019-06-26 MED ORDER — INSULIN ASPART 100 UNIT/ML ~~LOC~~ SOLN
0.0000 [IU] | Freq: Three times a day (TID) | SUBCUTANEOUS | Status: DC
  Administered 2019-06-27: 09:00:00 3 [IU] via SUBCUTANEOUS
  Administered 2019-06-27: 13:00:00 2 [IU] via SUBCUTANEOUS
  Administered 2019-06-28: 5 [IU] via SUBCUTANEOUS
  Administered 2019-06-28: 2 [IU] via SUBCUTANEOUS

## 2019-06-26 NOTE — Progress Notes (Signed)
Progress Note  Patient Name: Holly Scott Date of Encounter: 06/26/2019  Primary Cardiologist: Dr. Mayford Knife  Subjective   Denies any CP or SOB since the admission.  Inpatient Medications    Scheduled Meds: . aspirin EC  81 mg Oral Daily  . atorvastatin  80 mg Oral q1800  . insulin aspart  0-9 Units Subcutaneous TID WC  . irbesartan  150 mg Oral Daily  . metoprolol tartrate  25 mg Oral BID  . multivitamin  1 tablet Oral Daily  . sodium chloride flush  3 mL Intravenous Once   Continuous Infusions: . heparin 600 Units/hr (06/26/19 0423)   PRN Meds: acetaminophen, nitroGLYCERIN, ondansetron (ZOFRAN) IV   Vital Signs    Vitals:   06/25/19 1643 06/25/19 2050 06/26/19 0404 06/26/19 0743  BP: (!) 112/54 102/63 98/65 (!) 104/57  Pulse: 77 78 66   Resp:      Temp: 98.1 F (36.7 C) 97.9 F (36.6 C) 98.2 F (36.8 C)   TempSrc: Oral Oral Oral   SpO2: 100% 98% 99%   Weight:   47.3 kg   Height:        Intake/Output Summary (Last 24 hours) at 06/26/2019 1031 Last data filed at 06/25/2019 2300 Gross per 24 hour  Intake 96.42 ml  Output --  Net 96.42 ml   Last 3 Weights 06/26/2019 06/25/2019 06/24/2019  Weight (lbs) 104 lb 4.8 oz 103 lb 1.6 oz 102 lb 4.8 oz  Weight (kg) 47.31 kg 46.766 kg 46.403 kg      Telemetry    NSR without significant ventricular ectopy - Personally Reviewed  ECG    NSR with questionable q wave in the septal leads - Personally Reviewed  Physical Exam   GEN: No acute distress.   Neck: No JVD Cardiac: RRR, no murmurs, rubs, or gallops.  Respiratory: Clear to auscultation bilaterally. GI: Soft, nontender, non-distended  MS: No edema; No deformity. Neuro:  Nonfocal  Psych: Normal affect   Labs    High Sensitivity Troponin:   Recent Labs  Lab 06/24/19 1528 06/24/19 1738 06/25/19 0053 06/25/19 1401  TROPONINIHS 391* 659* 3,107* 2,659*      Chemistry Recent Labs  Lab 06/24/19 1528 06/25/19 0053  NA 137 140  K 4.5 4.3  CL  99 102  CO2 26 28  GLUCOSE 198* 215*  BUN 13 10  CREATININE 0.79 0.73  CALCIUM 10.1 9.5  GFRNONAA >60 >60  GFRAA >60 >60  ANIONGAP 12 10     Hematology Recent Labs  Lab 06/24/19 1528 06/25/19 0053 06/26/19 0415  WBC 13.1* 10.1 8.2  RBC 4.80 4.63 4.69  HGB 13.6 12.7 13.1  HCT 42.2 40.2 41.7  MCV 87.9 86.8 88.9  MCH 28.3 27.4 27.9  MCHC 32.2 31.6 31.4  RDW 12.6 12.6 12.5  PLT 314 291 289    BNPNo results for input(s): BNP, PROBNP in the last 168 hours.   DDimer No results for input(s): DDIMER in the last 168 hours.   Radiology      Cardiac Studies   TTE: 06/25/2019   1. Wall motion abnormalities in the LAD distribution (infarct vs  stunning).  2. There is basal and mid anteroseptal, anterior and apical septal and  anterior walls.. Left ventricular ejection fraction, by estimation, is 40  to 45%. The left ventricle has mildly decreased function. The left  ventricle has no regional wall motion  abnormalities. Left ventricular diastolic parameters are consistent with  Grade I diastolic dysfunction (impaired relaxation).  3. Right ventricular systolic function is normal. The right ventricular  size is normal. There is normal pulmonary artery systolic pressure.  4. The mitral valve is normal in structure. Mild to moderate mitral valve  regurgitation. No evidence of mitral stenosis.  5. The aortic valve is normal in structure. Aortic valve regurgitation is  not visualized. No aortic stenosis is present.  6. The inferior vena cava is normal in size with greater than 50%  respiratory variability, suggesting right atrial pressure of 3 mmHg.   Patient Profile     64 y.o. female with PMH of HTN and DM II with no prior h/o CAD presented to [facility] with chest pain and ruled in for NSTEMI.  Assessment & Plan    1. NSTEMI  - hs Trop 391 --> 659-->3107-->2659, no new labs, I will order  - we will continue iv heparin  - Initial EKG showed minimal ST elevation  in V1-V2, ?q wave in septal leads. EKG does not meet STEMI criteria. Subsequent EKG showed improved in ST elevation, but continue to show ?q wave in septal leads, we will repeat  - Wall motion abnormalities in the LAD distribution (infarct vs  stunning). LVEF 40-45% with basal and mid anteroseptal, anterior and apical septal and  anterior walls.. Left ventricular ejection fraction, by estimation, is 40 to 45%. The left ventricle has mildly decreased function.  - plan for cardiac catheterization on Monday, patient agrees, this was discussed with her daughter who agrees as well.  - Risk and benefit of procedure explained to the patient who display clear understanding and agree to proceed.Discussed with patient possible procedural risk include bleeding, vascular injury, renal injury, arrythmia, MI, stroke and loss of limb or life.  - total cholesterol, HDL and triglyceride at goal. LDL 97, goal is <70. Continue high dose lipitor.  2. HTN: on Telmisartan at home, temporarily substituted for irbesartan in the hospital. Metoprolol added.   3. DM II: uncontrolled with hemoglobin A1C 9.3  4. Leg cramps: present before admission, worse during this admission. K is normal. Will continue lipitor, if leg cramps continue to worsen, may need to switch statin.  For questions or updates, please contact [place] Please consult [email or web address] for contact info under       Signed, Ena Dawley, MD  06/26/2019, 10:31 AM

## 2019-06-26 NOTE — Progress Notes (Signed)
Daughter declined cath video

## 2019-06-26 NOTE — Progress Notes (Signed)
Dr. Delton See spoke with patients daughter and updated her on patient status and explained cath procedure to her via phone.

## 2019-06-26 NOTE — Progress Notes (Signed)
ANTICOAGULATION CONSULT NOTE - Follow Up Consult  Pharmacy Consult for Heparin Indication: chest pain/ACS  No Known Allergies  Patient Measurements: Height: 5' (152.4 cm) Weight: 104 lb 4.8 oz (47.3 kg) IBW/kg (Calculated) : 45.5 Heparin Dosing Weight: 46.8kg  Vital Signs: Temp: 98.2 F (36.8 C) (03/14 0404) Temp Source: Oral (03/14 0404) BP: 104/57 (03/14 0743) Pulse Rate: 66 (03/14 0404)  Labs: Recent Labs    06/24/19 1528 06/24/19 1528 06/24/19 1738 06/25/19 0053 06/25/19 0950 06/25/19 1401 06/26/19 0415  HGB 13.6   < >  --  12.7  --   --  13.1  HCT 42.2  --   --  40.2  --   --  41.7  PLT 314  --   --  291  --   --  289  APTT 23*  --   --   --   --   --   --   LABPROT 12.5  --   --   --   --   --   --   INR 0.9  --   --   --   --   --   --   HEPARINUNFRC  --   --   --  0.45 0.51  --  0.51  CREATININE 0.79  --   --  0.73  --   --   --   TROPONINIHS 391*   < > 659* 3,107*  --  2,659*  --    < > = values in this interval not displayed.    Estimated Creatinine Clearance: 50.4 mL/min (by C-G formula based on SCr of 0.73 mg/dL).  Assessment:  Anticoag: Heparin for ACS started at [facility]. HL 0.51 in goal. Hgb 13.1 stable. Plts 289 WNL.   Goal of Therapy:  Heparin level 0.3-0.7 units/ml Monitor platelets by anticoagulation protocol: Yes   Plan:  Cont Heparin at 600 units/hr Daily HL and CBC Cath Monday   Thirza Pellicano S. Merilynn Finland, PharmD, BCPS Clinical Staff Pharmacist [email or web address] Merilynn Finland, Darrielle Pflieger Stilling[place] 06/26/2019,8:36 AM

## 2019-06-27 ENCOUNTER — Inpatient Hospital Stay (HOSPITAL_COMMUNITY)
Admission: EM | Disposition: A | Discharge status: Home / Self Care | Payer: Self-pay | Source: Home / Self Care | Attending: Cardiology

## 2019-06-27 DIAGNOSIS — I25110 Atherosclerotic heart disease of native coronary artery with unstable angina pectoris: Secondary | ICD-10-CM

## 2019-06-27 DIAGNOSIS — I2510 Atherosclerotic heart disease of native coronary artery without angina pectoris: Secondary | ICD-10-CM

## 2019-06-27 HISTORY — PX: CORONARY STENT INTERVENTION: CATH118234

## 2019-06-27 HISTORY — PX: LEFT HEART CATH AND CORONARY ANGIOGRAPHY: CATH118249

## 2019-06-27 LAB — GLUCOSE, CAPILLARY
Glucose-Capillary: 203 mg/dL — ABNORMAL HIGH (ref 70–99)
Glucose-Capillary: 167 mg/dL — ABNORMAL HIGH (ref 70–99)
Glucose-Capillary: 109 mg/dL — ABNORMAL HIGH (ref 70–99)
Glucose-Capillary: 262 mg/dL — ABNORMAL HIGH (ref 70–99)

## 2019-06-27 LAB — BASIC METABOLIC PANEL
Anion gap: 11 (ref 5–15)
BUN: 11 mg/dL (ref 8–23)
CO2: 27 mmol/L (ref 22–32)
Calcium: 9.2 mg/dL (ref 8.9–10.3)
Chloride: 103 mmol/L (ref 98–111)
Creatinine, Ser: 0.69 mg/dL (ref 0.44–1.00)
GFR calc Af Amer: >60 mL/min (ref >60)
GFR calc non Af Amer: >60 mL/min (ref >60)
Glucose, Bld: 232 mg/dL — ABNORMAL HIGH (ref 70–99)
Potassium: 4.8 mmol/L (ref 3.5–5.1)
Sodium: 141 mmol/L (ref 135–145)

## 2019-06-27 LAB — CBC
HCT: 38.6 % (ref 36.0–46.0)
Hemoglobin: 12.2 g/dL (ref 12.0–15.0)
MCH: 27.9 pg (ref 26.0–34.0)
MCHC: 31.6 g/dL (ref 30.0–36.0)
MCV: 88.3 fL (ref 80.0–100.0)
Platelets: 262 10*3/uL (ref 150–400)
RBC: 4.37 MIL/uL (ref 3.87–5.11)
RDW: 12.4 % (ref 11.5–15.5)
WBC: 7.5 10*3/uL (ref 4.0–10.5)
nRBC: 0 % (ref 0.0–0.2)

## 2019-06-27 LAB — POCT ACTIVATED CLOTTING TIME: Activated Clotting Time: 367 seconds

## 2019-06-27 LAB — MAGNESIUM: Magnesium: 1.7 mg/dL (ref 1.7–2.4)

## 2019-06-27 LAB — HEPARIN LEVEL (UNFRACTIONATED): Heparin Unfractionated: 0.46 IU/mL (ref 0.30–0.70)

## 2019-06-27 SURGERY — LEFT HEART CATH AND CORONARY ANGIOGRAPHY
Anesthesia: LOCAL

## 2019-06-27 MED ORDER — TICAGRELOR 90 MG PO TABS
ORAL_TABLET | ORAL | Status: DC | PRN
  Administered 2019-06-27: 180 mg via ORAL

## 2019-06-27 MED ORDER — MIDAZOLAM HCL 2 MG/2ML IJ SOLN
INTRAMUSCULAR | Status: AC
  Filled 2019-06-27: qty 2

## 2019-06-27 MED ORDER — HEPARIN (PORCINE) IN NACL 1000-0.9 UT/500ML-% IV SOLN
INTRAVENOUS | Status: DC | PRN
  Administered 2019-06-27 (×2): 500 mL

## 2019-06-27 MED ORDER — FENTANYL CITRATE (PF) 100 MCG/2ML IJ SOLN
INTRAMUSCULAR | Status: DC | PRN
  Administered 2019-06-27 (×3): 25 ug via INTRAVENOUS

## 2019-06-27 MED ORDER — MORPHINE SULFATE (PF) 10 MG/ML IV SOLN
INTRAVENOUS | Status: DC | PRN
  Administered 2019-06-27: 2 mg via INTRAVENOUS

## 2019-06-27 MED ORDER — TICAGRELOR 90 MG PO TABS
ORAL_TABLET | ORAL | Status: AC
  Filled 2019-06-27: qty 3

## 2019-06-27 MED ORDER — MORPHINE SULFATE (PF) 10 MG/ML IV SOLN
INTRAVENOUS | Status: AC
  Filled 2019-06-27: qty 1

## 2019-06-27 MED ORDER — IOHEXOL 350 MG/ML SOLN
INTRAVENOUS | Status: DC | PRN
  Administered 2019-06-27: 130 mL

## 2019-06-27 MED ORDER — HEPARIN SODIUM (PORCINE) 1000 UNIT/ML IJ SOLN
INTRAMUSCULAR | Status: AC
  Filled 2019-06-27: qty 1

## 2019-06-27 MED ORDER — VERAPAMIL HCL 2.5 MG/ML IV SOLN
INTRAVENOUS | Status: DC | PRN
  Administered 2019-06-27 (×3): 500 ug via INTRACORONARY

## 2019-06-27 MED ORDER — VERAPAMIL HCL 2.5 MG/ML IV SOLN
INTRAVENOUS | Status: DC | PRN
  Administered 2019-06-27: 10 mL via INTRA_ARTERIAL

## 2019-06-27 MED ORDER — NITROGLYCERIN 1 MG/10 ML FOR IR/CATH LAB
INTRA_ARTERIAL | Status: DC | PRN
  Administered 2019-06-27 (×3): 200 ug via INTRACORONARY
  Administered 2019-06-27: 50 ug via INTRACORONARY
  Administered 2019-06-27: 200 ug via INTRACORONARY

## 2019-06-27 MED ORDER — VERAPAMIL HCL 2.5 MG/ML IV SOLN
INTRAVENOUS | Status: AC
  Filled 2019-06-27: qty 2

## 2019-06-27 MED ORDER — HEPARIN (PORCINE) IN NACL 1000-0.9 UT/500ML-% IV SOLN
INTRAVENOUS | Status: AC
  Filled 2019-06-27: qty 1000

## 2019-06-27 MED ORDER — TIROFIBAN HCL IN NACL 5-0.9 MG/100ML-% IV SOLN
INTRAVENOUS | Status: AC | PRN
  Administered 2019-06-27: 0.15 ug/kg/min via INTRAVENOUS

## 2019-06-27 MED ORDER — LIDOCAINE HCL (PF) 1 % IJ SOLN
INTRAMUSCULAR | Status: AC
  Filled 2019-06-27: qty 30

## 2019-06-27 MED ORDER — MIDAZOLAM HCL 2 MG/2ML IJ SOLN
INTRAMUSCULAR | Status: DC | PRN
  Administered 2019-06-27 (×2): 1 mg via INTRAVENOUS
  Administered 2019-06-27: 2 mg via INTRAVENOUS

## 2019-06-27 MED ORDER — TIROFIBAN (AGGRASTAT) BOLUS VIA INFUSION
INTRAVENOUS | Status: DC | PRN
  Administered 2019-06-27: 1172.5 ug via INTRAVENOUS

## 2019-06-27 MED ORDER — FENTANYL CITRATE (PF) 100 MCG/2ML IJ SOLN
INTRAMUSCULAR | Status: AC
  Filled 2019-06-27: qty 2

## 2019-06-27 MED ORDER — NITROGLYCERIN 1 MG/10 ML FOR IR/CATH LAB
INTRA_ARTERIAL | Status: AC
  Filled 2019-06-27: qty 10

## 2019-06-27 MED ORDER — LIDOCAINE HCL (PF) 1 % IJ SOLN
INTRAMUSCULAR | Status: DC | PRN
  Administered 2019-06-27: 2 mL

## 2019-06-27 MED ORDER — HEPARIN SODIUM (PORCINE) 1000 UNIT/ML IJ SOLN
INTRAMUSCULAR | Status: DC | PRN
  Administered 2019-06-27: 4000 [IU] via INTRAVENOUS
  Administered 2019-06-27: 5000 [IU] via INTRAVENOUS

## 2019-06-27 SURGICAL SUPPLY — 18 items
BALLN SAPPHIRE 2.5X15 (BALLOONS) ×2
BALLN SAPPHIRE ~~LOC~~ 3.0X18 (BALLOONS) ×1 IMPLANT
BALLOON SAPPHIRE 2.5X15 (BALLOONS) IMPLANT
CATH OPTITORQUE TIG 4.0 5F (CATHETERS) ×1 IMPLANT
CATH VISTA GUIDE 6FR XBLAD3.5 (CATHETERS) ×1 IMPLANT
DEVICE RAD COMP TR BAND LRG (VASCULAR PRODUCTS) ×1 IMPLANT
GLIDESHEATH SLEND SS 6F .021 (SHEATH) ×1 IMPLANT
GUIDEWIRE INQWIRE 1.5J.035X260 (WIRE) IMPLANT
INQWIRE 1.5J .035X260CM (WIRE) ×2
KIT ENCORE 26 ADVANTAGE (KITS) ×1 IMPLANT
KIT HEART LEFT (KITS) ×2 IMPLANT
PACK CARDIAC CATHETERIZATION (CUSTOM PROCEDURE TRAY) ×2 IMPLANT
SHEATH PROBE COVER 6X72 (BAG) ×1 IMPLANT
STENT SYNERGY XD 2.75X38 (Permanent Stent) IMPLANT
SYNERGY XD 2.75X38 (Permanent Stent) ×2 IMPLANT
TRANSDUCER W/STOPCOCK (MISCELLANEOUS) ×2 IMPLANT
TUBING CIL FLEX 10 FLL-RA (TUBING) ×2 IMPLANT
WIRE ASAHI PROWATER 180CM (WIRE) ×1 IMPLANT

## 2019-06-27 NOTE — H&P (View-Only) (Signed)
° °Progress Note ° °Patient Name: Holly Scott °Date of Encounter: 06/27/2019 ° °Primary Cardiologist: Traci Turner, MD  ° °Subjective  ° °Feeling good this morning. No new complaints. No recurrent chest pain. Denies SOB, palpitations, dizziness, or lightheadedness. Still having leg cramping. No complaints of claudication, cold feet, rubor on dependency.  ° °Inpatient Medications  °  °Scheduled Meds: °• aspirin EC  81 mg Oral Daily  °• atorvastatin  80 mg Oral q1800  °• insulin aspart  0-5 Units Subcutaneous QHS  °• insulin aspart  0-9 Units Subcutaneous TID WC  °• irbesartan  150 mg Oral Daily  °• metoprolol tartrate  25 mg Oral BID  °• multivitamin  1 tablet Oral Daily  °• sodium chloride flush  3 mL Intravenous Once  °• sodium chloride flush  3 mL Intravenous Q12H  ° °Continuous Infusions: °• sodium chloride    °• sodium chloride 10 mL/hr at 06/27/19 0512  °• heparin 600 Units/hr (06/26/19 0423)  ° °PRN Meds: °sodium chloride, acetaminophen, nitroGLYCERIN, ondansetron (ZOFRAN) IV, sodium chloride flush  ° °Vital Signs  °  °Vitals:  ° 06/26/19 0743 06/26/19 1338 06/26/19 2102 06/27/19 0418  °BP: (!) 104/57 116/67 112/60 (!) 132/93  °Pulse:  67 82   °Resp:  20  20  °Temp:  98.8 °F (37.1 °C) 98.2 °F (36.8 °C) 98 °F (36.7 °C)  °TempSrc:  Oral Oral Oral  °SpO2:  100% 99% 100%  °Weight:    46.9 kg  °Height:      ° ° °Intake/Output Summary (Last 24 hours) at 06/27/2019 0759 °Last data filed at 06/27/2019 0600 °Gross per 24 hour  °Intake 658.65 ml  °Output --  °Net 658.65 ml  ° °Filed Weights  ° 06/25/19 0558 06/26/19 0404 06/27/19 0418  °Weight: 46.8 kg 47.3 kg 46.9 kg  ° ° °Telemetry  °  °NSR - Personally Reviewed ° °ECG  °  °No new tracings - Personally Reviewed ° °Physical Exam  ° °GEN: No acute distress.   °Neck: No JVD, no carotid bruits °Cardiac: RRR, no murmurs, rubs, or gallops.  °Respiratory: Clear to auscultation bilaterally, no wheezes/ rales/ rhonchi °GI: NABS, Soft, nontender, non-distended  °MS: No  edema; No deformity. °Neuro:  Nonfocal, moving all extremities spontaneously °Psych: Normal affect  ° °Labs  °  °Chemistry °Recent Labs  °Lab 06/24/19 °1528 06/25/19 °0053  °NA 137 140  °K 4.5 4.3  °CL 99 102  °CO2 26 28  °GLUCOSE 198* 215*  °BUN 13 10  °CREATININE 0.79 0.73  °CALCIUM 10.1 9.5  °GFRNONAA >60 >60  °GFRAA >60 >60  °ANIONGAP 12 10  °  ° °Hematology °Recent Labs  °Lab 06/25/19 °0053 06/26/19 °0415 06/27/19 °0405  °WBC 10.1 8.2 7.5  °RBC 4.63 4.69 4.37  °HGB 12.7 13.1 12.2  °HCT 40.2 41.7 38.6  °MCV 86.8 88.9 88.3  °MCH 27.4 27.9 27.9  °MCHC 31.6 31.4 31.6  °RDW 12.6 12.5 12.4  °PLT 291 289 262  ° ° °Cardiac EnzymesNo results for input(s): TROPONINI in the last 168 hours. No results for input(s): TROPIPOC in the last 168 hours.  ° °BNPNo results for input(s): BNP, PROBNP in the last 168 hours.  ° °DDimer No results for input(s): DDIMER in the last 168 hours.  ° °Radiology  °  °ECHOCARDIOGRAM COMPLETE ° °Result Date: 06/25/2019 °   ECHOCARDIOGRAM REPORT   Patient Name:   Holly Scott Date of Exam: 06/25/2019 Medical Rec #:  [id number]              Height:       60.0 in Accession #:    [id number]           Weight:       103.1 lb Date of Birth:  06/27/1955           BSA:          1.408 m² Patient Age:    63 years             BP:           121/62 mmHg Patient Gender: F                    HR:           73 bpm. Exam Location:  Inpatient Procedure: 2D Echo, Cardiac Doppler and Color Doppler Indications:    121-121.4 ST elevation (STEMI) and non-ST elevation (NSTEMI)                 nyocardial infarction  History:        Patient has no prior history of Echocardiogram examinations.                 Acute MI, Signs/Symptoms:Chest Pain; Risk Factors:Hypertension                 and Diabetes.  Sonographer:    Tina West RDCS Referring Phys: [id number] CALLIE E GOODRICH IMPRESSIONS  1. Wall motion abnormalities in the LAD distribution (infarct vs stunning).  2. There is basal and mid anteroseptal, anterior and apical  septal and anterior walls.. Left ventricular ejection fraction, by estimation, is 40 to 45%. The left ventricle has mildly decreased function. The left ventricle has no regional wall motion abnormalities. Left ventricular diastolic parameters are consistent with Grade I diastolic dysfunction (impaired relaxation).  3. Right ventricular systolic function is normal. The right ventricular size is normal. There is normal pulmonary artery systolic pressure.  4. The mitral valve is normal in structure. Mild to moderate mitral valve regurgitation. No evidence of mitral stenosis.  5. The aortic valve is normal in structure. Aortic valve regurgitation is not visualized. No aortic stenosis is present.  6. The inferior vena cava is normal in size with greater than 50% respiratory variability, suggesting right atrial pressure of 3 mmHg. FINDINGS  Left Ventricle: There is basal and mid anteroseptal, anterior and apical septal and anterior walls. Left ventricular ejection fraction, by estimation, is 40 to 45%. The left ventricle has mildly decreased function. The left ventricle has no regional wall motion abnormalities. The left ventricular internal cavity size was normal in size. There is no left ventricular hypertrophy. Left ventricular diastolic parameters are consistent with Grade I diastolic dysfunction (impaired relaxation). Normal left ventricular filling pressure. Right Ventricle: The right ventricular size is normal. No increase in right ventricular wall thickness. Right ventricular systolic function is normal. There is normal pulmonary artery systolic pressure. Left Atrium: Left atrial size was normal in size. Right Atrium: Right atrial size was normal in size. Pericardium: There is no evidence of pericardial effusion. Mitral Valve: The mitral valve is normal in structure. Normal mobility of the mitral valve leaflets. Mild to moderate mitral valve regurgitation, with posteriorly-directed jet. No evidence of mitral valve  stenosis. Tricuspid Valve: The tricuspid valve is normal in structure. Tricuspid valve regurgitation is trivial. No evidence of tricuspid stenosis. Aortic Valve: The aortic valve is normal in structure. Aortic valve regurgitation is not visualized. No aortic stenosis is present. Pulmonic Valve: The pulmonic valve   was normal in structure. Pulmonic valve regurgitation is trivial. No evidence of pulmonic stenosis. Aorta: The aortic root is normal in size and structure. Venous: The inferior vena cava is normal in size with greater than 50% respiratory variability, suggesting right atrial pressure of 3 mmHg. IAS/Shunts: No atrial level shunt detected by color flow Doppler.  LEFT VENTRICLE PLAX 2D LVIDd:         3.90 cm     Diastology LVIDs:         2.40 cm     LV e' lateral:   7.40 cm/s LV PW:         1.10 cm     LV E/e' lateral: 8.3 LV IVS:        1.20 cm     LV e' medial:    6.85 cm/s LVOT diam:     1.70 cm     LV E/e' medial:  8.9 LV SV:         35 LV SV Index:   25 LVOT Area:     2.27 cm²  LV Volumes (MOD) LV vol d, MOD A2C: 53.9 ml LV vol d, MOD A4C: 57.6 ml LV vol s, MOD A2C: 33.6 ml LV vol s, MOD A4C: 29.0 ml LV SV MOD A2C:     20.3 ml LV SV MOD A4C:     57.6 ml LV SV MOD BP:      25.0 ml RIGHT VENTRICLE            IVC RV S prime:     8.59 cm/s  IVC diam: 1.60 cm TAPSE (M-mode): 1.4 cm LEFT ATRIUM             Index       RIGHT ATRIUM          Index LA diam:        2.70 cm 1.92 cm/m²  RA Area:     6.13 cm² LA Vol (A2C):   19.8 ml 14.06 ml/m² RA Volume:   9.63 ml  6.84 ml/m² LA Vol (A4C):   16.1 ml 11.43 ml/m² LA Biplane Vol: 17.8 ml 12.64 ml/m²  AORTIC VALVE             PULMONIC VALVE LVOT Vmax:   72.60 cm/s  PV Vmax:          1.50 m/s LVOT Vmean:  51.100 cm/s PV Peak grad:     9.0 mmHg LVOT VTI:    0.156 m     PR End Diast Vel: 1.49 msec  AORTA Ao Root diam: 2.50 cm Ao Asc diam:  2.60 cm MITRAL VALVE MV Area (PHT): 4.49 cm²     SHUNTS MV Decel Time: 169 msec     Systemic VTI:  0.16 m MR Peak grad:   127.2 mmHg   Systemic Diam: 1.70 cm MR Mean grad:   94.0 mmHg MR Vmax:        564.00 cm/s MR Vmean:       474.0 cm/s MR PISA:        0.57 cm² MR PISA Radius: 0.30 cm MV E velocity: 61.20 cm/s MV A velocity: 97.30 cm/s MV E/A ratio:  0.63 Katarina Nelson MD Electronically signed by Katarina Nelson MD Signature Date/Time: 06/25/2019/4:09:03 PM    Final   ° ° °Cardiac Studies  ° °Echocardiogram 06/25/19: °1. Wall motion abnormalities in the LAD distribution (infarct vs  °stunning).  ° 2. There is basal and mid anteroseptal, anterior and apical septal and  °  anterior walls.. Left ventricular ejection fraction, by estimation, is 40  °to 45%. The left ventricle has mildly decreased function. The left  °ventricle has no regional wall motion  °abnormalities. Left ventricular diastolic parameters are consistent with  °Grade I diastolic dysfunction (impaired relaxation).  ° 3. Right ventricular systolic function is normal. The right ventricular  °size is normal. There is normal pulmonary artery systolic pressure.  ° 4. The mitral valve is normal in structure. Mild to moderate mitral valve  °regurgitation. No evidence of mitral stenosis.  ° 5. The aortic valve is normal in structure. Aortic valve regurgitation is  °not visualized. No aortic stenosis is present.  ° 6. The inferior vena cava is normal in size with greater than 50%  °respiratory variability, suggesting right atrial pressure of 3 mmHg.  ° °Patient Profile  °   °63 y.o. female with PMH of HTN and DM type 2, who presented to [facility] with chest pain, found to have elevated troponins and was transferred to [facility] for further evaluation. ° °Assessment & Plan  °  °1. NSTEMI: trop peaked at 3107. EKG initially with minimal STE in V1-2 (improved on subsequent EKGs), and ?Q waves in septal leads. Echo with EF 40-45% and WMA in the LAD distribution (infarct vs stunning). She is anticipating LHC today °- Keep NPO for LHC today °- Continue heparin gtt for now °- Continue aspirin and statin ° °2.  Acute combined CHF: Echo with EF 40-45% this admission. Possibly ischemic in etiology given #1. She appears euvolemic on exam °- Continue metoprolol  °- Will f/u LHC today  ° °3. HTN: BP stable. Started on metoprolol this admission in addition to ARB.  °- Continue metoprolol and irbesartan (anticipate resuming home telmisartan at discharge) ° °4. DM type 2: Poorly controlled with A1C 9.3 this admission. Home metformin on hold. °- Consider jardiance at discharge °- Continue ISS while admitted. ° °5. Dyslipidemia: LDL 97 this admission  °- Continue atorvastatin ° °6. Leg cramps: present prior to admission, though worse now.  °- Will check BMET and Mg today °- May need to consider alternative statin if continues to worsen ° ° °For questions or updates, please contact [facility] °Please consult [email or web address] for contact info under Cardiology/STEMI. °  °   °Signed, °Talisha Erby M. Tanav Orsak, PA-C  °06/27/2019, 7:59 AM   °[phone] ° °

## 2019-06-27 NOTE — Progress Notes (Signed)
Progress Note  Patient Name: Holly Scott Date of Encounter: 06/27/2019  Primary Cardiologist: Armanda Magic, MD   Subjective   Feeling good this morning. No new complaints. No recurrent chest pain. Denies SOB, palpitations, dizziness, or lightheadedness. Still having leg cramping. No complaints of claudication, cold feet, rubor on dependency.   Inpatient Medications    Scheduled Meds:  aspirin EC  81 mg Oral Daily   atorvastatin  80 mg Oral q1800   insulin aspart  0-5 Units Subcutaneous QHS   insulin aspart  0-9 Units Subcutaneous TID WC   irbesartan  150 mg Oral Daily   metoprolol tartrate  25 mg Oral BID   multivitamin  1 tablet Oral Daily   sodium chloride flush  3 mL Intravenous Once   sodium chloride flush  3 mL Intravenous Q12H   Continuous Infusions:  sodium chloride     sodium chloride 10 mL/hr at 06/27/19 0512   heparin 600 Units/hr (06/26/19 0423)   PRN Meds: sodium chloride, acetaminophen, nitroGLYCERIN, ondansetron (ZOFRAN) IV, sodium chloride flush   Vital Signs    Vitals:   06/26/19 0743 06/26/19 1338 06/26/19 2102 06/27/19 0418  BP: (!) 104/57 116/67 112/60 (!) 132/93  Pulse:  67 82   Resp:  20  20  Temp:  98.8 F (37.1 C) 98.2 F (36.8 C) 98 F (36.7 C)  TempSrc:  Oral Oral Oral  SpO2:  100% 99% 100%  Weight:    46.9 kg  Height:        Intake/Output Summary (Last 24 hours) at 06/27/2019 0759 Last data filed at 06/27/2019 0600 Gross per 24 hour  Intake 658.65 ml  Output --  Net 658.65 ml   Filed Weights   06/25/19 0558 06/26/19 0404 06/27/19 0418  Weight: 46.8 kg 47.3 kg 46.9 kg    Telemetry    NSR - Personally Reviewed  ECG    No new tracings - Personally Reviewed  Physical Exam   GEN: No acute distress.   Neck: No JVD, no carotid bruits Cardiac: RRR, no murmurs, rubs, or gallops.  Respiratory: Clear to auscultation bilaterally, no wheezes/ rales/ rhonchi GI: NABS, Soft, nontender, non-distended  MS: No  edema; No deformity. Neuro:  Nonfocal, moving all extremities spontaneously Psych: Normal affect   Labs    Chemistry Recent Labs  Lab 06/24/19 1528 06/25/19 0053  NA 137 140  K 4.5 4.3  CL 99 102  CO2 26 28  GLUCOSE 198* 215*  BUN 13 10  CREATININE 0.79 0.73  CALCIUM 10.1 9.5  GFRNONAA >60 >60  GFRAA >60 >60  ANIONGAP 12 10     Hematology Recent Labs  Lab 06/25/19 0053 06/26/19 0415 06/27/19 0405  WBC 10.1 8.2 7.5  RBC 4.63 4.69 4.37  HGB 12.7 13.1 12.2  HCT 40.2 41.7 38.6  MCV 86.8 88.9 88.3  MCH 27.4 27.9 27.9  MCHC 31.6 31.4 31.6  RDW 12.6 12.5 12.4  PLT 291 289 262    Cardiac EnzymesNo results for input(s): TROPONINI in the last 168 hours. No results for input(s): TROPIPOC in the last 168 hours.   BNPNo results for input(s): BNP, PROBNP in the last 168 hours.   DDimer No results for input(s): DDIMER in the last 168 hours.   Radiology    ECHOCARDIOGRAM COMPLETE  Result Date: 06/25/2019    ECHOCARDIOGRAM REPORT   Patient Name:   Holly Scott Date of Exam: 06/25/2019 Medical Rec #:  [id number]  Height:       60.0 in Accession #:    [id number]           Weight:       103.1 lb Date of Birth:  10/17/1955           BSA:          1.408 m Patient Age:    63 years             BP:           121/62 mmHg Patient Gender: F                    HR:           73 bpm. Exam Location:  Inpatient Procedure: 2D Echo, Cardiac Doppler and Color Doppler Indications:    121-121.4 ST elevation (STEMI) and non-ST elevation (NSTEMI)                 nyocardial infarction  History:        Patient has no prior history of Echocardiogram examinations.                 Acute MI, Signs/Symptoms:Chest Pain; Risk Factors:Hypertension                 and Diabetes.  Sonographer:    Sheralyn Boatman RDCS Referring Phys: [id number] CALLIE E GOODRICH IMPRESSIONS  1. Wall motion abnormalities in the LAD distribution (infarct vs stunning).  2. There is basal and mid anteroseptal, anterior and apical  septal and anterior walls.. Left ventricular ejection fraction, by estimation, is 40 to 45%. The left ventricle has mildly decreased function. The left ventricle has no regional wall motion abnormalities. Left ventricular diastolic parameters are consistent with Grade I diastolic dysfunction (impaired relaxation).  3. Right ventricular systolic function is normal. The right ventricular size is normal. There is normal pulmonary artery systolic pressure.  4. The mitral valve is normal in structure. Mild to moderate mitral valve regurgitation. No evidence of mitral stenosis.  5. The aortic valve is normal in structure. Aortic valve regurgitation is not visualized. No aortic stenosis is present.  6. The inferior vena cava is normal in size with greater than 50% respiratory variability, suggesting right atrial pressure of 3 mmHg. FINDINGS  Left Ventricle: There is basal and mid anteroseptal, anterior and apical septal and anterior walls. Left ventricular ejection fraction, by estimation, is 40 to 45%. The left ventricle has mildly decreased function. The left ventricle has no regional wall motion abnormalities. The left ventricular internal cavity size was normal in size. There is no left ventricular hypertrophy. Left ventricular diastolic parameters are consistent with Grade I diastolic dysfunction (impaired relaxation). Normal left ventricular filling pressure. Right Ventricle: The right ventricular size is normal. No increase in right ventricular wall thickness. Right ventricular systolic function is normal. There is normal pulmonary artery systolic pressure. Left Atrium: Left atrial size was normal in size. Right Atrium: Right atrial size was normal in size. Pericardium: There is no evidence of pericardial effusion. Mitral Valve: The mitral valve is normal in structure. Normal mobility of the mitral valve leaflets. Mild to moderate mitral valve regurgitation, with posteriorly-directed jet. No evidence of mitral valve  stenosis. Tricuspid Valve: The tricuspid valve is normal in structure. Tricuspid valve regurgitation is trivial. No evidence of tricuspid stenosis. Aortic Valve: The aortic valve is normal in structure. Aortic valve regurgitation is not visualized. No aortic stenosis is present. Pulmonic Valve: The pulmonic valve  was normal in structure. Pulmonic valve regurgitation is trivial. No evidence of pulmonic stenosis. Aorta: The aortic root is normal in size and structure. Venous: The inferior vena cava is normal in size with greater than 50% respiratory variability, suggesting right atrial pressure of 3 mmHg. IAS/Shunts: No atrial level shunt detected by color flow Doppler.  LEFT VENTRICLE PLAX 2D LVIDd:         3.90 cm     Diastology LVIDs:         2.40 cm     LV e' lateral:   7.40 cm/s LV PW:         1.10 cm     LV E/e' lateral: 8.3 LV IVS:        1.20 cm     LV e' medial:    6.85 cm/s LVOT diam:     1.70 cm     LV E/e' medial:  8.9 LV SV:         35 LV SV Index:   25 LVOT Area:     2.27 cm  LV Volumes (MOD) LV vol d, MOD A2C: 53.9 ml LV vol d, MOD A4C: 57.6 ml LV vol s, MOD A2C: 33.6 ml LV vol s, MOD A4C: 29.0 ml LV SV MOD A2C:     20.3 ml LV SV MOD A4C:     57.6 ml LV SV MOD BP:      25.0 ml RIGHT VENTRICLE            IVC RV S prime:     8.59 cm/s  IVC diam: 1.60 cm TAPSE (M-mode): 1.4 cm LEFT ATRIUM             Index       RIGHT ATRIUM          Index LA diam:        2.70 cm 1.92 cm/m  RA Area:     6.13 cm LA Vol (A2C):   19.8 ml 14.06 ml/m RA Volume:   9.63 ml  6.84 ml/m LA Vol (A4C):   16.1 ml 11.43 ml/m LA Biplane Vol: 17.8 ml 12.64 ml/m  AORTIC VALVE             PULMONIC VALVE LVOT Vmax:   72.60 cm/s  PV Vmax:          1.50 m/s LVOT Vmean:  51.100 cm/s PV Peak grad:     9.0 mmHg LVOT VTI:    0.156 m     PR End Diast Vel: 1.49 msec  AORTA Ao Root diam: 2.50 cm Ao Asc diam:  2.60 cm MITRAL VALVE MV Area (PHT): 4.49 cm     SHUNTS MV Decel Time: 169 msec     Systemic VTI:  0.16 m MR Peak grad:   127.2 mmHg   Systemic Diam: 1.70 cm MR Mean grad:   94.0 mmHg MR Vmax:        564.00 cm/s MR Vmean:       474.0 cm/s MR PISA:        0.57 cm MR PISA Radius: 0.30 cm MV E velocity: 61.20 cm/s MV A velocity: 97.30 cm/s MV E/A ratio:  0.63 Tobias Alexander MD Electronically signed by Tobias Alexander MD Signature Date/Time: 06/25/2019/4:09:03 PM    Final     Cardiac Studies   Echocardiogram 06/25/19: 1. Wall motion abnormalities in the LAD distribution (infarct vs  stunning).  2. There is basal and mid anteroseptal, anterior and apical septal and  anterior walls.. Left ventricular ejection fraction, by estimation, is 40  to 45%. The left ventricle has mildly decreased function. The left  ventricle has no regional wall motion  abnormalities. Left ventricular diastolic parameters are consistent with  Grade I diastolic dysfunction (impaired relaxation).  3. Right ventricular systolic function is normal. The right ventricular  size is normal. There is normal pulmonary artery systolic pressure.  4. The mitral valve is normal in structure. Mild to moderate mitral valve  regurgitation. No evidence of mitral stenosis.  5. The aortic valve is normal in structure. Aortic valve regurgitation is  not visualized. No aortic stenosis is present.  6. The inferior vena cava is normal in size with greater than 50%  respiratory variability, suggesting right atrial pressure of 3 mmHg.   Patient Profile     64 y.o. female with PMH of HTN and DM type 2, who presented to [facility] with chest pain, found to have elevated troponins and was transferred to [facility] for further evaluation.  Assessment & Plan    1. NSTEMI: trop peaked at 3107. EKG initially with minimal STE in V1-2 (improved on subsequent EKGs), and ?Q waves in septal leads. Echo with EF 40-45% and WMA in the LAD distribution (infarct vs stunning). She is anticipating LHC today - Keep NPO for LHC today - Continue heparin gtt for now - Continue aspirin and statin  2.  Acute combined CHF: Echo with EF 40-45% this admission. Possibly ischemic in etiology given #1. She appears euvolemic on exam - Continue metoprolol  - Will f/u LHC today   3. HTN: BP stable. Started on metoprolol this admission in addition to ARB.  - Continue metoprolol and irbesartan (anticipate resuming home telmisartan at discharge)  4. DM type 2: Poorly controlled with A1C 9.3 this admission. Home metformin on hold. - Consider jardiance at discharge - Continue ISS while admitted.  5. Dyslipidemia: LDL 97 this admission  - Continue atorvastatin  6. Leg cramps: present prior to admission, though worse now.  - Will check BMET and Mg today - May need to consider alternative statin if continues to worsen   For questions or updates, please contact [place] Please consult [email or web address] for contact info under Cardiology/STEMI.      Signed, Abigail Butts, PA-C  06/27/2019, 7:59 AM   [phone]

## 2019-06-27 NOTE — Progress Notes (Signed)
ANTICOAGULATION CONSULT NOTE - Follow Up Consult  Pharmacy Consult for Heparin Indication: chest pain/ACS  No Known Allergies  Patient Measurements: Height: 5' (152.4 cm) Weight: 103 lb 6.4 oz (46.9 kg) IBW/kg (Calculated) : 45.5 Heparin Dosing Weight: 46.8kg  Vital Signs: Temp: 98 F (36.7 C) (03/15 0418) Temp Source: Oral (03/15 0418) BP: 126/53 (03/15 0857) Pulse Rate: 73 (03/15 0857)  Labs: Recent Labs    06/24/19 1528 06/24/19 1528 06/24/19 1738 06/25/19 0053 06/25/19 0053 06/25/19 0950 06/25/19 1401 06/26/19 0415 06/27/19 0405 06/27/19 0838  HGB 13.6   < >  --  12.7   < >  --   --  13.1 12.2  --   HCT 42.2   < >  --  40.2  --   --   --  41.7 38.6  --   PLT 314   < >  --  291  --   --   --  289 262  --   APTT 23*  --   --   --   --   --   --   --   --   --   LABPROT 12.5  --   --   --   --   --   --   --   --   --   INR 0.9  --   --   --   --   --   --   --   --   --   HEPARINUNFRC  --   --   --  0.45   < > 0.51  --  0.51 0.46  --   CREATININE 0.79  --   --  0.73  --   --   --   --   --  0.69  TROPONINIHS 391*   < > 659* 3,107*  --   --  2,659*  --   --   --    < > = values in this interval not displayed.    Estimated Creatinine Clearance: 50.4 mL/min (by C-G formula based on SCr of 0.69 mg/dL).  Assessment: 63 yoF on heparin for ACS r/o. Heparin therapeutic, CBC stable, cath today.   Goal of Therapy:  Heparin level 0.3-0.7 units/ml Monitor platelets by anticoagulation protocol: Yes   Plan:  -Continue heparin 600 units/h -Daily heparin level and CBC -Cath today  Fredonia Highland, PharmD, BCPS Clinical Pharmacist [phone] Please check AMION for all [facility] numbers 06/27/2019

## 2019-06-27 NOTE — Progress Notes (Addendum)
Inpatient Diabetes Program Recommendations  AACE/ADA: New Consensus Statement on Inpatient Glycemic Control (2015)  Target Ranges:  Prepandial:   less than 140 mg/dL      Peak postprandial:   less than 180 mg/dL (1-2 hours)      Critically ill patients:  140 - 180 mg/dL   Lab Results  Component Value Date   GLUCAP 203 (H) 06/27/2019   HGBA1C 9.3 (H) 06/25/2019    Review of Glycemic Control  Diabetes history: DM 2 Outpatient Diabetes medications: Metformin 1000 mg qam, 500 mg qpm (prescribed 1000 mg bid), Amaryl 2 mg Daily Current orders for Inpatient glycemic control: Novolog 0-9 units tid + hs  Inpatient Diabetes Program Recommendations:    Increase Novolog to 0-15 units tid.  Note pt sees Dr. [facility], Endocrinologist, in [place]. Last visit 05/12/19. Pt dietary indiscretion + not taking metformin as prescribed. A1c 9.6% at that time.  A1c this admission 9.3% noted pt still not taking metformin as prescribed. Pt will ultimately need to follow up with Dr. Allena Katz outpatient sooner than later for follow up.  Addendum 11:23 am:    Spoke with pt and daughter at bedside. Pt has seen Dr. Allena Katz twice. The family personally know him. Pt knew he instructed her to take Metformin 2 tablets twice a day but thought her A1c was elevated because of the sugar foods she had the day before her appointment.  Discussed Pts A1c then versus now. Discussed how it is calculating an average glucose from 3 months of information and 1-2 days of extreme eating would not change the average that much. Instructed them to go back and see Dr. Allena Katz within 1-2 months. Also told her to take the metformin 1000 mg bid as instructed by Dr. Allena Katz. Also informed them the pt may be a good candidate for an SGLT-2 medication and to ask Dr. Allena Katz with their next visit. Told them follow up should be every 3-6 months until her A1c is at goal.  Thanks,  Christena Deem RN, MSN, BC-ADM Inpatient Diabetes Coordinator Team  Pager [phone] (8a-5p)

## 2019-06-27 NOTE — Interval H&P Note (Signed)
History and Physical Interval Note:  06/27/2019 5:11 PM  Holly Scott  has presented today for surgery, with the diagnosis of NSTEMI.  The various methods of treatment have been discussed with the patient and family. After consideration of risks, benefits and other options for treatment, the patient has consented to  Procedure(s): LEFT HEART CATH AND CORONARY ANGIOGRAPHY (N/A)  PERCUTANEOUS CORONARY INTERVENTION   as a surgical intervention.  The patient's history has been reviewed, patient examined, no change in status, stable for surgery.  I have reviewed the patient's chart and labs.  Questions were answered to the patient's satisfaction.    Cath Lab Visit (complete for each Cath Lab visit)  Clinical Evaluation Leading to the Procedure:   ACS: Yes.    Non-ACS:    Anginal Classification: CCS IV  Anti-ischemic medical therapy: Maximal Therapy (2 or more classes of medications)  Non-Invasive Test Results: No non-invasive testing performed  Prior CABG: No previous CABG    Bryan Lemma

## 2019-06-28 ENCOUNTER — Other Ambulatory Visit: Payer: Self-pay | Admitting: Cardiology

## 2019-06-28 DIAGNOSIS — I25110 Atherosclerotic heart disease of native coronary artery with unstable angina pectoris: Secondary | ICD-10-CM

## 2019-06-28 DIAGNOSIS — M79604 Pain in right leg: Secondary | ICD-10-CM

## 2019-06-28 DIAGNOSIS — M79605 Pain in left leg: Secondary | ICD-10-CM

## 2019-06-28 LAB — BASIC METABOLIC PANEL
Anion gap: 12 (ref 5–15)
BUN: 13 mg/dL (ref 8–23)
CO2: 22 mmol/L (ref 22–32)
Calcium: 9.1 mg/dL (ref 8.9–10.3)
Chloride: 104 mmol/L (ref 98–111)
Creatinine, Ser: 0.69 mg/dL (ref 0.44–1.00)
GFR calc Af Amer: >60 mL/min (ref >60)
GFR calc non Af Amer: >60 mL/min (ref >60)
Glucose, Bld: 186 mg/dL — ABNORMAL HIGH (ref 70–99)
Potassium: 3.8 mmol/L (ref 3.5–5.1)
Sodium: 138 mmol/L (ref 135–145)

## 2019-06-28 LAB — POCT ACTIVATED CLOTTING TIME: Activated Clotting Time: 230 seconds

## 2019-06-28 LAB — CBC
HCT: 37.8 % (ref 36.0–46.0)
Hemoglobin: 12.2 g/dL (ref 12.0–15.0)
MCH: 28.1 pg (ref 26.0–34.0)
MCHC: 32.3 g/dL (ref 30.0–36.0)
MCV: 87.1 fL (ref 80.0–100.0)
Platelets: 262 10*3/uL (ref 150–400)
RBC: 4.34 MIL/uL (ref 3.87–5.11)
RDW: 12.6 % (ref 11.5–15.5)
WBC: 8.8 10*3/uL (ref 4.0–10.5)
nRBC: 0 % (ref 0.0–0.2)

## 2019-06-28 LAB — GLUCOSE, CAPILLARY
Glucose-Capillary: 182 mg/dL — ABNORMAL HIGH (ref 70–99)
Glucose-Capillary: 259 mg/dL — ABNORMAL HIGH (ref 70–99)

## 2019-06-28 MED ORDER — TICAGRELOR 90 MG PO TABS: 90.0000 mg | ORAL_TABLET | Freq: Two times a day (BID) | ORAL | 3 refills | Status: AC

## 2019-06-28 MED ORDER — SODIUM CHLORIDE 0.9 % IV SOLN: 250.0000 mL | INTRAVENOUS | Status: DC | PRN

## 2019-06-28 MED ORDER — MORPHINE SULFATE (PF) 2 MG/ML IV SOLN: 2.0000 mg | INTRAVENOUS | Status: DC | PRN

## 2019-06-28 MED ORDER — SODIUM CHLORIDE 0.9 % IV SOLN: INTRAVENOUS | Status: DC

## 2019-06-28 MED ORDER — JARDIANCE 10 MG PO TABS: 10.0000 mg | ORAL_TABLET | Freq: Every day | ORAL | 1 refills | Status: AC

## 2019-06-28 MED ORDER — NITROGLYCERIN 0.4 MG SL SUBL: 0.4000 mg | SUBLINGUAL_TABLET | SUBLINGUAL | 12 refills | Status: DC | PRN

## 2019-06-28 MED ORDER — METOPROLOL TARTRATE 25 MG PO TABS: 25.0000 mg | ORAL_TABLET | Freq: Two times a day (BID) | ORAL | 3 refills | Status: DC

## 2019-06-28 MED ORDER — SODIUM CHLORIDE 0.9% FLUSH: 3.0000 mL | Freq: Two times a day (BID) | INTRAVENOUS | Status: DC

## 2019-06-28 MED ORDER — ATORVASTATIN CALCIUM 80 MG PO TABS: 80.0000 mg | ORAL_TABLET | Freq: Every day | ORAL | 3 refills | Status: DC

## 2019-06-28 MED ORDER — HYDRALAZINE HCL 20 MG/ML IJ SOLN: 10.0000 mg | INTRAMUSCULAR | Status: AC | PRN

## 2019-06-28 MED ORDER — LABETALOL HCL 5 MG/ML IV SOLN: 10.0000 mg | INTRAVENOUS | Status: AC | PRN

## 2019-06-28 MED ORDER — SODIUM CHLORIDE 0.9% FLUSH: 3.0000 mL | INTRAVENOUS | Status: DC | PRN

## 2019-06-28 MED ORDER — TICAGRELOR 90 MG PO TABS
90.0000 mg | ORAL_TABLET | Freq: Two times a day (BID) | ORAL | Status: DC
  Administered 2019-06-28: 09:00:00 90 mg via ORAL
  Filled 2019-06-28: qty 1

## 2019-06-28 MED FILL — NITROGLYCERIN 0.4 MG TAB SL: 0.4 | 8 days supply | Qty: 25 | Fill #0

## 2019-06-28 MED FILL — BRILINTA 90 MG TABLET: 90 | 30 days supply | Qty: 60 | Fill #0

## 2019-06-28 MED FILL — JARDIANCE 10 MG TABLET: 10 | 30 days supply | Qty: 30 | Fill #0

## 2019-06-28 MED FILL — ATORVASTATIN CALCIUM 80 MG: 80 | 90 days supply | Qty: 90 | Fill #0

## 2019-06-28 MED FILL — METOPROLOL TARTRATE 25 MG T: 25 | 90 days supply | Qty: 180 | Fill #0

## 2019-06-28 MED FILL — Ticagrelor Tab 90 MG: ORAL | Qty: 1 | Status: AC

## 2019-06-28 NOTE — Care Management (Signed)
06-28-19 0931 Benefits check submitted for Bril[employer]. Case Manager following for cost. Graves-Bigelow, Alyla Pietila Kaye, RN,BSN Case Manager   

## 2019-06-28 NOTE — Progress Notes (Signed)
Ed completed with pt and daughter. Daughter translated (completed interpreting waiver). Very receptive. Understands importance of Brilinta. Will refer to [place] [facility] virtual. Pt does not drive so virtual will be best. Pt is interested in participating in Virtual Cardiac and Pulmonary Rehab. Pt advised that Virtual Cardiac and Pulmonary Rehab is provided at no cost to the patient.  Checklist:  1. Pt has smart device  ie smartphone and/or ipad for downloading an app  Yes 2. Reliable internet/wifi service    Yes 3. Understands how to use their smartphone and navigate within an app.  Yes Pt verbalized understanding and is in agreement. [id number] Ethelda Chick CES, ACSM 11:53 AM 06/28/2019

## 2019-06-28 NOTE — Discharge Summary (Signed)
Discharge Summary    Patient ID: Holly Scott MRN: [id number]; DOB: Feb 23, 1956  Admit date: 06/24/2019 Discharge date: 06/28/2019  Primary Care Provider: Patient, No Pcp Per  Primary Cardiologist: Armanda Magic, MD  Primary Electrophysiologist:  None   Discharge Diagnoses    Active Problems:   NSTEMI (non-ST elevated myocardial infarction) (HCC)   Essential hypertension   DM type 2, goal HbA1c < 7% (HCC)   Coronary artery disease involving native coronary artery of native heart with unstable angina pectoris [facility])  Diagnostic Studies/Procedures    CORONARY STENT INTERVENTION  LEFT HEART CATH AND CORONARY ANGIOGRAPHY  06/27/2019  Conclusion   CULPRIT LESION: prox LAD to Mid LAD lesion is 95% stenosed.  A drug-eluting stent was successfully placed (covering an area distal to the lesion that was accidentally treated with the balloon because of watermelon seeding) using a SYNERGY XD 4.69G29.  The stent was postdilated with a tapering from 3.1 2.9 mm.  Post intervention, there is a 0% residual stenosis.  Mid LAD lesion is 30% stenosed.  Ramus lesion is 30% stenosed.  ------------------  There is mild left ventricular systolic dysfunction. The left ventricular ejection fraction is 45-50% by visual estimate. LV end diastolic pressure is normal.   SUMMARY  Severe single-vessel disease with proximal LAD 95% lesion that had component of spasm and likely thrombus with distal embolization. ? Successful DES PCI covering the lesion segment as well as a slight segment downstream due to concern for possible balloon related dissection. ->  Resolute Onyx DES 2.7 mm x 38 mm-- >postdilated 3.1 mm tapered to 2.9 mm. ? Brief episode of no reflow--post PCI TIMI-3 flow up to the apex and TIMI II flow around.  (Likely distal embolization) ? Chest pain-free upon leaving Cath Lab.  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  Mildly reduced EF with mild anterior hypokinesis but  otherwise relatively preserved.  Normal EDP.   RECOMMENDATIONS  Due to brief episode of no reflow, will run Aggrastat for 3 hours following PCI.  Transfer back to nursing unit for ongoing care.   Continue aggressive GDMT for CAD -> consider calcium channel blocker for possible spasm  Would likely recheck echocardiogram in 2 to 3 months to reassess EF. Bryan Lemma, MD     Echocardiogram 06/25/19 IMPRESSIONS  1. Wall motion abnormalities in the LAD distribution (infarct vs  stunning).  2. There is basal and mid anteroseptal, anterior and apical septal and  anterior walls.. Left ventricular ejection fraction, by estimation, is 40  to 45%. The left ventricle has mildly decreased function. The left  ventricle has no regional wall motion  abnormalities. Left ventricular diastolic parameters are consistent with  Grade I diastolic dysfunction (impaired relaxation).  3. Right ventricular systolic function is normal. The right ventricular  size is normal. There is normal pulmonary artery systolic pressure.  4. The mitral valve is normal in structure. Mild to moderate mitral valve  regurgitation. No evidence of mitral stenosis.  5. The aortic valve is normal in structure. Aortic valve regurgitation is  not visualized. No aortic stenosis is present.  6. The inferior vena cava is normal in size with greater than 50%  respiratory variability, suggesting right atrial pressure of 3 mmHg.  _____________   History of Present Illness     Holly Scott is a 64 y.o. female with a history of hypertension and type 2 diabetes mellitus who presented to [facility] ED with chest pain and was found to NSTEMI. Transferred to [facility] for  left heart catheterization.   She had no prior known cardiac history and no prior cardiac work-up. Patient is followed at [facility] for primary care. She also sees Dr. Allena Katz (Endocrinology) at [facility] for management of her diabetes.  Current home medications include Aspirin 81mg  daily, Telmisartan 40mg  daily, Glimepiride 2mg  daily, and Metformin 1000mg  twice daily. BP has been elevated recently and PCP increased Telmisartan from 20mg  to 40mg  daily at last office visit about 1 month ago. She has no smoking history. No known family history of heart disease but father did have a stroke in his 43's. She stays very active taking care of her large family but does not sound like any formal exercise.   Patient presented to the [facility] ED on 06/24/19 for further evaluation of chest pain. Patient reported intermittent substernal non-radiating chest pain that she described as a burning since the prior day. Pain was intermittent lasting for about 5-10 minutes at a time before resolving. Pain was worse with activity on day of presentation and she did vomit once but no associated shortness of breath or diaphoresis. Patient did not tell her family until today when pain became very severe. Family called 911 at this time. No palpitations, dizziness, or syncope. No orthopnea, PND, or edema. A few nights prior, she had significant anterior leg pain at night that kept her from sleep. She occasionally has cramps in her calves at night but this was different. However, this was only an isolated event. She denies any claudication. No recent fevers or illnesses. No abdominal pain. She did go to the dentist today and had bleeding with a deep cleaning but no other abnormal bleeding. No hematochezia, melena, or hematuria.   In the ED, vitals stable. EKG showed normal sinus rhythm, rate 87 bpm, with minimal (<67mm) ST elevation in V1-V2 but did not meet criteria for STEMI. These changes improved on repeat EKG. Initial high-sensitivity troponin elevated at 391. Repeat pending. Chest x-ray showed no acute findings. WBC 13.1, Hgb 13.6, Plts 314. Na 137, K 4.5, Glucose 198, BUN 13, Cr 0.79. COVID-19 negative. She was started on IV Heparin and given Aspirin  325mg  in the ED. Transferred to for heart catheterization on Monday.   Of note, patient does not speak 65's. Daughter-in-law Daylin Eads) was present for evaluation and served as interpreter.   Hospital Course     Consultants: None  1. NSTEMI: -trop peaked at 3107. EKG initially with minimal STE in V1-2 (improved on subsequent EKGs), and ?Q waves in septal leads.  -Echo with EF 40-45% and WMA in the LAD distribution (infarct vs stunning).  -cardiac cath with 1v obstructive disease with prox to mid LAD 95% with nonobstructive disease of 30% in mid LAD and mid ramus.  The LAD had a component of spasm and likely thrombus with distal embolization.  S/P PCI/DES of the LAD. - Continue aspirin 81mg  daily, Brilinta 90mg  BID, high dose statin and BB  2.Acute combined CHF: -Echo with EF 40-45% this admission.  -Possibly ischemic in etiology given #1.  -she does not appear volume overloaded on exam -LVEDP was normal at cath at 0m  -Continue metoprolol 25mg  BID -restart ARB - home med is micardis 40mg  daily -repeat 2D echo in 2 months post revascularization to see if LVF has improved  3.HTN: -BP controlled at 118/62mmHg -continue Lopressor 25mg  BID  -restart Telmisartan 40mg  daily  4. DM type 2: -Poorly controlled with A1C 9.3% this admission.  -restart metformin on 3/18 -  Add Jardiance 10mg  daily for risk reduction -followup outpt with PCP  5. Dyslipidemia: -LDL 97 this admission  -started on Atorvastatin 80mg  daily -repeat FLP and ALT in 6 weeks  6.  Leg cramps -this has been going on for 3 months -only occurs at night - no cramps with ambulation -mag mildly reduced at 1.7 > encouraged her to consider taking a magnesium supp OTC -K+ 3.8 > increase K+ rich veggies in diet -unlikely to be PAD by description but pulses are decreased in her legs and she is diabetic so will get LE arterial dopplers to assess further.   Patient has been seen by Dr. Mayford Knifeurner  today and deemed ready for discharge home. All follow up appointments have been scheduled. Discharge medications are listed below.   Did the patient have an acute coronary syndrome (MI, NSTEMI, STEMI, etc) this admission?:  Yes                               AHA/ACC Clinical Performance & Quality Measures: 1. Aspirin prescribed? - Yes 2. ADP Receptor Inhibitor (Plavix/Clopidogrel, Brilinta/Ticagrelor or Effient/Prasugrel) prescribed (includes medically managed patients)? - Yes 3. Beta Blocker prescribed? - Yes 4. High Intensity Statin (Lipitor 40-80mg  or Crestor 20-40mg ) prescribed? - Yes 5. EF assessed during THIS hospitalization? - Yes 6. For EF <40%, was ACEI/ARB prescribed? - Not Applicable (EF >/= 40%) 7. For EF <40%, Aldosterone Antagonist (Spironolactone or Eplerenone) prescribed? - Not Applicable (EF >/= 40%) 8. Cardiac Rehab Phase II ordered (Included Medically managed Patients)? - Yes   _____________  Discharge Vitals Blood pressure (!) 118/57, pulse 80, temperature 98.5 F (36.9 C), temperature source Oral, resp. rate 19, height 5' (1.524 m), weight 46.1 kg, SpO2 99 %.  Filed Weights   06/26/19 0404 06/27/19 0418 06/28/19 0626  Weight: 47.3 kg 46.9 kg 46.1 kg    Labs & Radiologic Studies    CBC Recent Labs    06/27/19 0405 06/28/19 0301  WBC 7.5 8.8  HGB 12.2 12.2  HCT 38.6 37.8  MCV 88.3 87.1  PLT 262 262   Basic Metabolic Panel Recent Labs    16/01/9602/15/21 0838 06/28/19 0301  NA 141 138  K 4.8 3.8  CL 103 104  CO2 27 22  GLUCOSE 232* 186*  BUN 11 13  CREATININE 0.69 0.69  CALCIUM 9.2 9.1  MG 1.7  --    Liver Function Tests No results for input(s): AST, ALT, ALKPHOS, BILITOT, PROT, ALBUMIN in the last 72 hours. No results for input(s): LIPASE, AMYLASE in the last 72 hours. High Sensitivity Troponin:   Recent Labs  Lab 06/24/19 1528 06/24/19 1738 06/25/19 0053 06/25/19 1401  TROPONINIHS 391* 659* 3,107* 2,659*    BNP Invalid input(s):  POCBNP D-Dimer No results for input(s): DDIMER in the last 72 hours. Hemoglobin A1C No results for input(s): HGBA1C in the last 72 hours. Fasting Lipid Panel No results for input(s): CHOL, HDL, LDLCALC, TRIG, CHOLHDL, LDLDIRECT in the last 72 hours. Thyroid Function Tests No results for input(s): TSH, T4TOTAL, T3FREE, THYROIDAB in the last 72 hours.  Invalid input(s): FREET3 _____________  DG Chest 2 View  Result Date: 06/24/2019 CLINICAL DATA:  Chest pain. Pain for 2 days. EXAM: CHEST - 2 VIEW COMPARISON:  None. FINDINGS: The cardiomediastinal contours are normal. The lungs are clear. Pulmonary vasculature is normal. No consolidation, pleural effusion, or pneumothorax. No acute osseous abnormalities are seen. IMPRESSION: Negative radiographs of the chest. Electronically  Signed   By: Narda Rutherford M.D.   On: 06/24/2019 16:04   CARDIAC CATHETERIZATION  Result Date: 06/27/2019  CULPRIT LESION: prox LAD to Mid LAD lesion is 95% stenosed.  A drug-eluting stent was successfully placed (covering an area distal to the lesion that was accidentally treated with the balloon because of watermelon seeding) using a SYNERGY XD 1.61W96.  The stent was postdilated with a tapering from 3.1 2.9 mm.  Post intervention, there is a 0% residual stenosis.  Mid LAD lesion is 30% stenosed.  Ramus lesion is 30% stenosed.  ------------------  There is mild left ventricular systolic dysfunction. The left ventricular ejection fraction is 45-50% by visual estimate. LV end diastolic pressure is normal.  SUMMARY  Severe single-vessel disease with proximal LAD 95% lesion that had component of spasm and likely thrombus with distal embolization.  Successful DES PCI covering the lesion segment as well as a slight segment downstream due to concern for possible balloon related dissection. ->  Resolute Onyx DES 2.7 mm x 38 mm-- >postdilated 3.1 mm tapered to 2.9 mm.  Brief episode of no reflow--post PCI TIMI-3 flow up to  the apex and TIMI II flow around.  (Likely distal embolization)  Chest pain-free upon leaving Cath Lab.  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  Mildly reduced EF with mild anterior hypokinesis but otherwise relatively preserved.  Normal EDP. RECOMMENDATIONS  Due to brief episode of no reflow, will run Aggrastat for 3 hours following PCI.  Transfer back to nursing unit for ongoing care.  Continue aggressive GDMT for CAD -> consider calcium channel blocker for possible spasm  Would likely recheck echocardiogram in 2 to 3 months to reassess EF. Bryan Lemma, MD  ECHOCARDIOGRAM COMPLETE  Result Date: 06/25/2019    ECHOCARDIOGRAM REPORT   Patient Name:   Holly Scott Date of Exam: 06/25/2019 Medical Rec #:  [id number]            Height:       60.0 in Accession #:    [id number]           Weight:       103.1 lb Date of Birth:  1956/02/07           BSA:          1.408 m Patient Age:    63 years             BP:           121/62 mmHg Patient Gender: F                    HR:           73 bpm. Exam Location:  Inpatient Procedure: 2D Echo, Cardiac Doppler and Color Doppler Indications:    121-121.4 ST elevation (STEMI) and non-ST elevation (NSTEMI)                 nyocardial infarction  History:        Patient has no prior history of Echocardiogram examinations.                 Acute MI, Signs/Symptoms:Chest Pain; Risk Factors:Hypertension                 and Diabetes.  Sonographer:    Sheralyn Boatman RDCS Referring Phys: [id number] CALLIE E GOODRICH IMPRESSIONS  1. Wall motion abnormalities in the LAD distribution (infarct vs stunning).  2. There is basal and mid anteroseptal, anterior and apical septal and  anterior walls.. Left ventricular ejection fraction, by estimation, is 40 to 45%. The left ventricle has mildly decreased function. The left ventricle has no regional wall motion abnormalities. Left ventricular diastolic parameters are consistent with Grade I diastolic dysfunction (impaired relaxation).  3.  Right ventricular systolic function is normal. The right ventricular size is normal. There is normal pulmonary artery systolic pressure.  4. The mitral valve is normal in structure. Mild to moderate mitral valve regurgitation. No evidence of mitral stenosis.  5. The aortic valve is normal in structure. Aortic valve regurgitation is not visualized. No aortic stenosis is present.  6. The inferior vena cava is normal in size with greater than 50% respiratory variability, suggesting right atrial pressure of 3 mmHg. FINDINGS  Left Ventricle: There is basal and mid anteroseptal, anterior and apical septal and anterior walls. Left ventricular ejection fraction, by estimation, is 40 to 45%. The left ventricle has mildly decreased function. The left ventricle has no regional wall motion abnormalities. The left ventricular internal cavity size was normal in size. There is no left ventricular hypertrophy. Left ventricular diastolic parameters are consistent with Grade I diastolic dysfunction (impaired relaxation). Normal left ventricular filling pressure. Right Ventricle: The right ventricular size is normal. No increase in right ventricular wall thickness. Right ventricular systolic function is normal. There is normal pulmonary artery systolic pressure. Left Atrium: Left atrial size was normal in size. Right Atrium: Right atrial size was normal in size. Pericardium: There is no evidence of pericardial effusion. Mitral Valve: The mitral valve is normal in structure. Normal mobility of the mitral valve leaflets. Mild to moderate mitral valve regurgitation, with posteriorly-directed jet. No evidence of mitral valve stenosis. Tricuspid Valve: The tricuspid valve is normal in structure. Tricuspid valve regurgitation is trivial. No evidence of tricuspid stenosis. Aortic Valve: The aortic valve is normal in structure. Aortic valve regurgitation is not visualized. No aortic stenosis is present. Pulmonic Valve: The pulmonic valve was  normal in structure. Pulmonic valve regurgitation is trivial. No evidence of pulmonic stenosis. Aorta: The aortic root is normal in size and structure. Venous: The inferior vena cava is normal in size with greater than 50% respiratory variability, suggesting right atrial pressure of 3 mmHg. IAS/Shunts: No atrial level shunt detected by color flow Doppler.  LEFT VENTRICLE PLAX 2D LVIDd:         3.90 cm     Diastology LVIDs:         2.40 cm     LV e' lateral:   7.40 cm/s LV PW:         1.10 cm     LV E/e' lateral: 8.3 LV IVS:        1.20 cm     LV e' medial:    6.85 cm/s LVOT diam:     1.70 cm     LV E/e' medial:  8.9 LV SV:         35 LV SV Index:   25 LVOT Area:     2.27 cm  LV Volumes (MOD) LV vol d, MOD A2C: 53.9 ml LV vol d, MOD A4C: 57.6 ml LV vol s, MOD A2C: 33.6 ml LV vol s, MOD A4C: 29.0 ml LV SV MOD A2C:     20.3 ml LV SV MOD A4C:     57.6 ml LV SV MOD BP:      25.0 ml RIGHT VENTRICLE            IVC RV S prime:  8.59 cm/s  IVC diam: 1.60 cm TAPSE (M-mode): 1.4 cm LEFT ATRIUM             Index       RIGHT ATRIUM          Index LA diam:        2.70 cm 1.92 cm/m  RA Area:     6.13 cm LA Vol (A2C):   19.8 ml 14.06 ml/m RA Volume:   9.63 ml  6.84 ml/m LA Vol (A4C):   16.1 ml 11.43 ml/m LA Biplane Vol: 17.8 ml 12.64 ml/m  AORTIC VALVE             PULMONIC VALVE LVOT Vmax:   72.60 cm/s  PV Vmax:          1.50 m/s LVOT Vmean:  51.100 cm/s PV Peak grad:     9.0 mmHg LVOT VTI:    0.156 m     PR End Diast Vel: 1.49 msec  AORTA Ao Root diam: 2.50 cm Ao Asc diam:  2.60 cm MITRAL VALVE MV Area (PHT): 4.49 cm     SHUNTS MV Decel Time: 169 msec     Systemic VTI:  0.16 m MR Peak grad:   127.2 mmHg  Systemic Diam: 1.70 cm MR Mean grad:   94.0 mmHg MR Vmax:        564.00 cm/s MR Vmean:       474.0 cm/s MR PISA:        0.57 cm MR PISA Radius: 0.30 cm MV E velocity: 61.20 cm/s MV A velocity: 97.30 cm/s MV E/A ratio:  0.63 Tobias Alexander MD Electronically signed by Tobias Alexander MD Signature Date/Time:  06/25/2019/4:09:03 PM    Final    Disposition   Pt is being discharged home today in good condition.  Follow-up Plans & Appointments    Follow-up Information    Quintella Reichert, MD Follow up.   Specialty: Cardiology Why: Our office will call you to schedule an appointment for 7-10 days.  Contact information: [address] [place] [address] [phone]        [facility] Follow up.   Why: Please follow up closely with you diabetes doctor for better blood sugar control.        [facility] Follow up.   Specialty: Cardiology Why: You will be called to schedule doppler study of your legs.  Contact information: [address], [address] [place] [address] [phone]         Discharge Instructions    Amb Referral to Cardiac Rehabilitation   Complete by: As directed    Diagnosis:  Coronary Stents NSTEMI PTCA     After initial evaluation and assessments completed: Virtual Based Care may be provided alone or in conjunction with Phase 2 Cardiac Rehab based on patient barriers.: Yes   Diet - low sodium heart healthy   Complete by: As directed    Discharge instructions   Complete by: As directed    PLEASE REMEMBER TO BRING ALL OF YOUR MEDICATIONS TO EACH OF YOUR FOLLOW-UP OFFICE VISITS.  PLEASE ATTEND ALL SCHEDULED FOLLOW-UP APPOINTMENTS.   Activity: Increase activity slowly as tolerated. You may shower, but no soaking baths (or swimming) for 1 week. No driving for 1 week. No lifting over 10 lbs for 2 weeks. No sexual activity for 2 weeks.   You May Return to Work: in 1 week (if applicable)  Wound Care: You may wash  cath site gently with soap and water. Keep cath site clean and dry. If you notice pain, swelling, bleeding or pus at your cath site, please call [phone].   Increase activity slowly   Complete by: As directed       Discharge Medications   Allergies as of 06/28/2019   No Known  Allergies     Medication List    STOP taking these medications   glimepiride 2 MG tablet Commonly known as: AMARYL     TAKE these medications   aspirin EC 81 MG tablet Take 81 mg by mouth daily.   atorvastatin 80 MG tablet Commonly known as: LIPITOR Take 1 tablet (80 mg total) by mouth daily at 6 PM.   Jardiance 10 MG Tabs tablet Generic drug: empagliflozin Take 10 mg by mouth daily before breakfast.   metFORMIN 500 MG tablet Commonly known as: GLUCOPHAGE Take 500-1,000 mg by mouth 2 (two) times daily with a meal. 1000 mg with breakfast and 500 mg with supper Notes to patient: Do not take until 3/18 then resume as usual.    metoprolol tartrate 25 MG tablet Commonly known as: LOPRESSOR Take 1 tablet (25 mg total) by mouth 2 (two) times daily.   nitroGLYCERIN 0.4 MG SL tablet Commonly known as: NITROSTAT Place 1 tablet (0.4 mg total) under the tongue every 5 (five) minutes x 3 doses as needed for chest pain.   telmisartan 40 MG tablet Commonly known as: MICARDIS Take 40 mg by mouth daily.   ticagrelor 90 MG Tabs tablet Commonly known as: BRILINTA Take 1 tablet (90 mg total) by mouth 2 (two) times daily.          Outstanding Labs/Studies   FLP and ALT in 6 weeks  Duration of Discharge Encounter   Greater than 30 minutes including physician time.  Signed, Daune Perch, NP 06/28/2019, 2:27 PM

## 2019-06-28 NOTE — Progress Notes (Signed)
Pt discharged per MD order. Discharge instructions and medications reviewed with pt and daughter in law. Incisional care provided. IV and tele removed. All questions answered to satisfaction. Pt escorted to private vehicle.

## 2019-06-28 NOTE — Plan of Care (Signed)

## 2019-06-28 NOTE — Progress Notes (Signed)
Appointment made for follow up with Dr. Mayford Knife on 07/13/19 at 8:20. I called pt's daughter and she is good with this appointment.   Berton Bon, AGNP-C [facility] 06/28/2019  4:08 PM

## 2019-06-28 NOTE — Progress Notes (Signed)
CARDIAC REHAB PHASE I   PRE:  Rate/Rhythm: 79 SR    BP: sitting 118/57    SaO2:   MODE:  Ambulation: 470 ft   POST:  Rate/Rhythm: 96 SR    BP: sitting 83/73, recheck 121/59     SaO2:   Pt felt well walking, no c/o. Independent. BP initially low but ok on recheck. I will f/u for education after daughter arrives. She plans to translate. [phone]   Harriet Masson CES, ACSM 06/28/2019 9:32 AM

## 2019-06-28 NOTE — Care Management (Signed)
Per Juliette Alcide B. W/[employer] : Co-pay amount for Brilinta 90 mg. twice a day for 30 day supply $9.20.  No PA required No Deductible Tier -3  Retail Pharmacy: [facility]  Pt. has LIS.

## 2019-06-28 NOTE — Progress Notes (Addendum)
Progress Note  Patient Name: Holly Scott Date of Encounter: 06/28/2019  Primary Cardiologist: Fransico Him, MD   Subjective   Denies any chest pain or SOB.  Cardiac cath yesterday showed prox to mid LAD 95% with nonobstructive disease of 30% in mid LAD and mid ramus.  The LAD had a component of spasm and likely thrombus with distal embolization.  S/P PCI of the LAD. Denies any chest pain this am.   Inpatient Medications    Scheduled Meds: . aspirin EC  81 mg Oral Daily  . atorvastatin  80 mg Oral q1800  . insulin aspart  0-5 Units Subcutaneous QHS  . insulin aspart  0-9 Units Subcutaneous TID WC  . irbesartan  150 mg Oral Daily  . metoprolol tartrate  25 mg Oral BID  . multivitamin  1 tablet Oral Daily  . sodium chloride flush  3 mL Intravenous Once  . sodium chloride flush  3 mL Intravenous Q12H  . sodium chloride flush  3 mL Intravenous Q12H  . ticagrelor  90 mg Oral BID   Continuous Infusions: . sodium chloride    . sodium chloride     PRN Meds: sodium chloride, acetaminophen, hydrALAZINE, labetalol, morphine injection, nitroGLYCERIN, ondansetron (ZOFRAN) IV, sodium chloride flush   Vital Signs    Vitals:   06/27/19 2021 06/27/19 2204 06/28/19 0626 06/28/19 0838  BP: (!) 103/40  (!) 114/58 (!) 118/57  Pulse: 81 76 79 80  Resp:      Temp: 97.7 F (36.5 C)  98.5 F (36.9 C)   TempSrc: Oral  Oral   SpO2: 100%  99%   Weight:   46.1 kg   Height:        Intake/Output Summary (Last 24 hours) at 06/28/2019 1056 Last data filed at 06/27/2019 2242 Gross per 24 hour  Intake 100 ml  Output --  Net 100 ml   Filed Weights   06/26/19 0404 06/27/19 0418 06/28/19 0626  Weight: 47.3 kg 46.9 kg 46.1 kg    Telemetry    NSR - Personally Reviewed  ECG    No new EKG to review - Personally Reviewed  Physical Exam   GEN: No acute distress.   Neck: No JVD Cardiac: RRR, no murmurs, rubs, or gallops.  Respiratory: Clear to auscultation bilaterally. GI:  Soft, nontender, non-distended  MS: No edema; No deformity. Right radial cath site with no hematoma Neuro:  Nonfocal  Psych: Normal affect   Labs    Chemistry Recent Labs  Lab 06/25/19 0053 06/27/19 0838 06/28/19 0301  NA 140 141 138  K 4.3 4.8 3.8  CL 102 103 104  CO2 28 27 22   GLUCOSE 215* 232* 186*  BUN 10 11 13   CREATININE 0.73 0.69 0.69  CALCIUM 9.5 9.2 9.1  GFRNONAA >60 >60 >60  GFRAA >60 >60 >60  ANIONGAP 10 11 12      Hematology Recent Labs  Lab 06/26/19 0415 06/27/19 0405 06/28/19 0301  WBC 8.2 7.5 8.8  RBC 4.69 4.37 4.34  HGB 13.1 12.2 12.2  HCT 41.7 38.6 37.8  MCV 88.9 88.3 87.1  MCH 27.9 27.9 28.1  MCHC 31.4 31.6 32.3  RDW 12.5 12.4 12.6  PLT 289 262 262    Cardiac EnzymesNo results for input(s): TROPONINI in the last 168 hours. No results for input(s): TROPIPOC in the last 168 hours.   BNPNo results for input(s): BNP, PROBNP in the last 168 hours.   DDimer No results for input(s): DDIMER in the last  168 hours.   Radiology    CARDIAC CATHETERIZATION  Result Date: 06/27/2019  CULPRIT LESION: prox LAD to Mid LAD lesion is 95% stenosed.  A drug-eluting stent was successfully placed (covering an area distal to the lesion that was accidentally treated with the balloon because of watermelon seeding) using a SYNERGY XD 6.23J62.  The stent was postdilated with a tapering from 3.1 2.9 mm.  Post intervention, there is a 0% residual stenosis.  Mid LAD lesion is 30% stenosed.  Ramus lesion is 30% stenosed.  ------------------  There is mild left ventricular systolic dysfunction. The left ventricular ejection fraction is 45-50% by visual estimate. LV end diastolic pressure is normal.  SUMMARY  Severe single-vessel disease with proximal LAD 95% lesion that had component of spasm and likely thrombus with distal embolization.  Successful DES PCI covering the lesion segment as well as a slight segment downstream due to concern for possible balloon related  dissection. ->  Resolute Onyx DES 2.7 mm x 38 mm-- >postdilated 3.1 mm tapered to 2.9 mm.  Brief episode of no reflow--post PCI TIMI-3 flow up to the apex and TIMI II flow around.  (Likely distal embolization)  Chest pain-free upon leaving Cath Lab.  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  Mildly reduced EF with mild anterior hypokinesis but otherwise relatively preserved.  Normal EDP. RECOMMENDATIONS  Due to brief episode of no reflow, will run Aggrastat for 3 hours following PCI.  Transfer back to nursing unit for ongoing care.  Continue aggressive GDMT for CAD -> consider calcium channel blocker for possible spasm  Would likely recheck echocardiogram in 2 to 3 months to reassess EF. Bryan Lemma, MD   Cardiac Studies   Cardiac Cath 06/2019 Conclusion    CULPRIT LESION: prox LAD to Mid LAD lesion is 95% stenosed.  A drug-eluting stent was successfully placed (covering an area distal to the lesion that was accidentally treated with the balloon because of watermelon seeding) using a SYNERGY XD 8.31D17.  The stent was postdilated with a tapering from 3.1 2.9 mm.  Post intervention, there is a 0% residual stenosis.  Mid LAD lesion is 30% stenosed.  Ramus lesion is 30% stenosed.  ------------------  There is mild left ventricular systolic dysfunction. The left ventricular ejection fraction is 45-50% by visual estimate. LV end diastolic pressure is normal.   SUMMARY  Severe single-vessel disease with proximal LAD 95% lesion that had component of spasm and likely thrombus with distal embolization. ? Successful DES PCI covering the lesion segment as well as a slight segment downstream due to concern for possible balloon related dissection. ->  Resolute Onyx DES 2.7 mm x 38 mm-- >postdilated 3.1 mm tapered to 2.9 mm. ? Brief episode of no reflow--post PCI TIMI-3 flow up to the apex and TIMI II flow around.  (Likely distal embolization) ? Chest pain-free upon leaving Cath  Lab.  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  Mildly reduced EF with mild anterior hypokinesis but otherwise relatively preserved.  Normal EDP.   RECOMMENDATIONS  Due to brief episode of no reflow, will run Aggrastat for 3 hours following PCI.  Transfer back to nursing unit for ongoing care.   Continue aggressive GDMT for CAD -> consider calcium channel blocker for possible spasm  Would likely recheck echocardiogram in 2 to 3 months to reassess EF.       Patient Profile     64 y.o. female with PMH of HTN and DM type 2, who presented to [facility] with chest  pain, found to have elevated troponins and was transferred to [facility] for further evaluation.  Assessment & Plan    1. NSTEMI:  -trop peaked at 3107. EKG initially with minimal STE in V1-2 (improved on subsequent EKGs), and ?Q waves in septal leads.  -Echo with EF 40-45% and WMA in the LAD distribution (infarct vs stunning).  -cardiac cath with 1v obstructive disease with prox to mid LAD 95% with nonobstructive disease of 30% in mid LAD and mid ramus.  The LAD had a component of spasm and likely thrombus with distal embolization.  S/P PCI of the LAD. - Continue aspirin 81mg  daily, Brilinta 90mg  BID, high dose statin and BB  2. Acute combined CHF:  -Echo with EF 40-45% this admission.  -Possibly ischemic in etiology given #1.  -she does not appear volume overloaded on exam -LVEDP was normal at cath at  -Continue metoprolol 25mg  BID -restart ARB - home med is micardis 40mg  daily -repeat 2D echo in 2 months post revascularization to see if LVF has improved  3. HTN:  -BP controlled at 118/53mmHg -continue Lopressor 25mg  BID  -restart Telmisartan 40mg  daily  4. DM type 2:  -Poorly controlled with A1C 9.3% this admission.  -restart metformin on 3/18 -Add Jardiance 10mg  daily for risk reduction -followup outpt with PCP  5. Dyslipidemia:  -LDL 97 this admission  -started on Atorvastatin 80mg  daily -repeat  FLP and ALT in 6 weeks  6.  Leg cramps -this has been going on for 3 months -only occurs at night - no cramps with ambulation -mag mildly reduced at 1.7 > encouraged her to consider taking a magnesium supp OTC -K+ 3.8 > increase K+ rich veggies in diet -unlikely to be PAD by description but pulses are decreased in her legs and she is diabetic so will get LE arterial dopplers to assess further.   Patient is stable for discharge home.  Will need TOC followup in 7-10 days.   For questions or updates, please contact [facility] Please consult [email or web address] for contact info under Cardiology/STEMI.      Signed, , MD  06/28/2019, 10:56 AM

## 2019-06-29 ENCOUNTER — Telehealth (HOSPITAL_COMMUNITY): Payer: Self-pay

## 2019-06-29 NOTE — Telephone Encounter (Signed)
Pt insurance is active and benefits verified through [place] $10, DED 0/0 met, out of pocket $3,900/$75 met, co-insurance 0%. no pre-authorization required. Passport, 06/29/2019_0 :36pm, REF# [id number]  Will contact patient to see if he is interested in the Cardiac Rehab Program. If interested, patient will need to complete follow up appt. Once completed, patient will be contacted for scheduling upon review by the RN Navigator.

## 2019-06-29 NOTE — Telephone Encounter (Signed)
Called patient to see if she is interested in the Cardiac Rehab Program. Patient expressed interest in the Virtual Cardiac Rehab only. Explained scheduling process, patient verbalized understanding. Will contact patient for scheduling once f/u has been completed.

## 2019-06-30 ENCOUNTER — Other Ambulatory Visit (HOSPITAL_COMMUNITY): Payer: Self-pay | Admitting: Cardiology

## 2019-06-30 DIAGNOSIS — I739 Peripheral vascular disease, unspecified: Secondary | ICD-10-CM

## 2019-07-03 ENCOUNTER — Telehealth: Payer: Self-pay | Admitting: Physician Assistant

## 2019-07-03 NOTE — Telephone Encounter (Signed)
Ms. Self daughter called because her mother has been having problems with nausea and vomiting over the last day or so.  She was hospitalized 3/12-3/16/2021 with a non-STEMI.  She had DES to the LAD.  She was discharged on several new medications including Brilinta, Lipitor, Jardiance, and metoprolol.  Previous medications include telmisartan and Metformin as well as aspirin.  She has been on all of these for years.  The daughter notes that she has not been eating with her medications.  She has not had any chest pain or shortness of breath.  Plan: Ms. Hagey is to eat when she takes her medicines  She is to hold everything except the aspirin, the metoprolol, and the Brilinta for now.  I emphasized the importance of keeping the aspirin, beta-blocker and Brilinta on board because of her recent MI.  If the nausea and vomiting improves, restart the Metformin and then the Micardis, medications she has been on for a long time.  If her symptoms resolve and she is able to take everything but the Jardiance without any difficulty, restart the Jardiance and see how it is tolerated.  Contact her PCP tomorrow to discuss diabetes management.  If her symptoms do not improve, contact us.  Theodore Demark, PA-C 07/03/2019 5:09 PM

## 2019-07-04 NOTE — Telephone Encounter (Signed)
I placed call to patient's daughter. Message on phone states customer is not accepting calls.

## 2019-07-04 NOTE — Telephone Encounter (Signed)
Tried again to reach patient.  Message states wireless customer is not available. No voicemail

## 2019-07-04 NOTE — Telephone Encounter (Signed)
Patient was supposed to be taking Jardiance and Metformin.  She needs to call her PCP ASAP this am for guidance on DM meds

## 2019-07-05 ENCOUNTER — Telehealth: Payer: Self-pay | Admitting: Cardiology

## 2019-07-05 NOTE — Telephone Encounter (Signed)
I spoke with the patient's daughter who states that the patient has not improved. She is still having N/V and is weak. She stopped taking metformin and telmisartan but has continued to take all other medications as prescribed. I advised the daughter to follow up with the patient's PCP. She states that she thinks that the new cardiac medications are making her mother sick. I advised her on the importance of taking these medications. Also advised that she is supposed to be taking both metformin and Jardiance and she needs to discuss these with her primary care.

## 2019-07-05 NOTE — Telephone Encounter (Signed)
If she cannot get in to see her PCP today then she needs to go to the ER

## 2019-07-05 NOTE — Telephone Encounter (Signed)
Spoke with patient's daughter who states that she has an appointment to see her PCP on Thursday. I advised her of Dr. Norris Cross recommendation for the patient to go to the ER. The daughter states that she does not think that the patient needs to go to the ER at this point. She states that the patient is now just feeling very weak and fatigued. I advised her to make sure the patient is staying hydrated. I again advised that she needs to be seen today and we recommended the ER. The daughter states that she will check back with her mother and if she seems to be worsening then they will take her to the ER.

## 2019-07-05 NOTE — Telephone Encounter (Signed)
° °  Pt's daughter calling, she said she spoke with PA yesterday. She said pt still throwing up and weak, she has no appetite and everything taste bitter even water. They did what the PA advised, they stop her metformin and some medications, however, pt still no improvement.   Please call

## 2019-07-11 ENCOUNTER — Ambulatory Visit (HOSPITAL_COMMUNITY)
Admission: RE | Admit: 2019-07-11 | Discharge: 2019-07-11 | Disposition: A | Discharge status: Home / Self Care | Payer: Medicare HMO | Source: Ambulatory Visit | Attending: Internal Medicine | Admitting: Internal Medicine

## 2019-07-11 ENCOUNTER — Other Ambulatory Visit: Payer: Self-pay

## 2019-07-11 DIAGNOSIS — M79604 Pain in right leg: Secondary | ICD-10-CM | POA: Insufficient documentation

## 2019-07-11 DIAGNOSIS — I739 Peripheral vascular disease, unspecified: Secondary | ICD-10-CM | POA: Diagnosis present

## 2019-07-11 DIAGNOSIS — M79605 Pain in left leg: Secondary | ICD-10-CM | POA: Insufficient documentation

## 2019-07-13 ENCOUNTER — Ambulatory Visit: Payer: Medicare HMO | Admitting: Cardiology

## 2019-07-13 ENCOUNTER — Other Ambulatory Visit: Payer: Self-pay

## 2019-07-13 ENCOUNTER — Encounter: Payer: Self-pay | Admitting: Cardiology

## 2019-07-13 VITALS — BP 122/70 | HR 66 | Ht 61.0 in | Wt 104.2 lb

## 2019-07-13 DIAGNOSIS — I25110 Atherosclerotic heart disease of native coronary artery with unstable angina pectoris: Secondary | ICD-10-CM | POA: Diagnosis not present

## 2019-07-13 DIAGNOSIS — I255 Ischemic cardiomyopathy: Secondary | ICD-10-CM

## 2019-07-13 DIAGNOSIS — E119 Type 2 diabetes mellitus without complications: Secondary | ICD-10-CM | POA: Diagnosis not present

## 2019-07-13 DIAGNOSIS — E7800 Pure hypercholesterolemia, unspecified: Secondary | ICD-10-CM | POA: Diagnosis not present

## 2019-07-13 DIAGNOSIS — I10 Essential (primary) hypertension: Secondary | ICD-10-CM

## 2019-07-13 MED ORDER — TELMISARTAN 40 MG PO TABS: 20.0000 mg | ORAL_TABLET | Freq: Every day | ORAL | 3 refills | Status: DC

## 2019-07-13 NOTE — Patient Instructions (Addendum)
Medication Instructions:  Your physician has recommended you make the following change in your medication:  1) START taking telmisartan (Micardis) 20 mg (1/2 tablet) daily   *If you need a refill on your cardiac medications before your next appointment, please call your pharmacy*   Lab Work: Fastin lipids and ALT in 4 weeks.  If you have labs (blood work) drawn today and your tests are completely normal, you will receive your results only by: Marland Kitchen MyChart Message (if you have MyChart) OR . A paper copy in the mail If you have any lab test that is abnormal or we need to change your treatment, we will call you to review the results.   Testing/Procedures: Your physician has requested that you have an echocardiogram. Echocardiography is a painless test that uses sound waves to create images of your heart. It provides your doctor with information about the size and shape of your heart and how well your heart's chambers and valves are working. This procedure takes approximately one hour. There are no restrictions for this procedure.    Follow-Up: At [facility], you and your health needs are our priority.  As part of our continuing mission to provide you with exceptional heart care, we have created designated Provider Care Teams.  These Care Teams include your primary Cardiologist (physician) and Advanced Practice Providers (APPs -  Physician Assistants and Nurse Practitioners) who all work together to provide you with the care you need, when you need it.  We recommend signing up for the patient portal called "MyChart".  Sign up information is provided on this After Visit Summary.  MyChart is used to connect with patients for Virtual Visits (Telemedicine).  Patients are able to view lab/test results, encounter notes, upcoming appointments, etc.  Non-urgent messages can be sent to your provider as well.   To learn more about what you can do with MyChart, go to [email or web address].    Your next  appointment:   6 month(s)  The format for your next appointment:   In Person  Provider:   Armanda Magic, MD

## 2019-07-13 NOTE — Progress Notes (Signed)
Cardiology Office Note:    Date:  07/13/2019   ID:  Sweet Jarvis, DOB Sep 30, 1955, MRN [id number]  PCP:  Clemencia Course, PA-C  Cardiologist:  Armanda Magic, MD    Referring MD: No ref. provider found   Chief Complaint  Patient presents with  . Coronary Artery Disease  . Hypertension  . Hyperlipidemia    History of Present Illness:    Holly Scott is a 64 y.o. female with a hx of hypertension andtype 2diabetes mellitus who presented to [facility] ED with chest pain and was found to NSTEMI.  She underwent cardiac cath showing severe single vessel with 95% LAD with spasm and thrombus with distal embolization and underwent PCI of LAD with residual 30% mid LAD and ramus. She is on DAPT with ASA and Brilinta.  2D echo showed mild LV dysfunction with EF 40-45% and mild to moderate MR.  After going home she had problems with Nausea and vomiting and stopped taking her metformin and Telmisartan and was seen by her PCP.  Her Nausea resolved and she is back on Metformin.  She was on Telmisartan prior to the MI with no problems.   She is here today for followup and is doing well.  She denies any chest pain or pressure, SOB, DOE, PND, orthopnea, LE edema, dizziness, palpitations or syncope. She is compliant with her meds and is tolerating meds with no SE.    Past Medical History:  Diagnosis Date  . Diabetes mellitus without complication (HCC)   . Hypertension     Past Surgical History:  Procedure Laterality Date  . CORONARY STENT INTERVENTION N/A 06/27/2019   Procedure: CORONARY STENT INTERVENTION;  Surgeon: Marykay Lex, MD;  Location: [facility] INVASIVE CV LAB;  Service: Cardiovascular;  Laterality: N/A;  . LEFT HEART CATH AND CORONARY ANGIOGRAPHY N/A 06/27/2019   Procedure: LEFT HEART CATH AND CORONARY ANGIOGRAPHY;  Surgeon: Marykay Lex, MD;  Location: [facility] INVASIVE CV LAB;  Service: Cardiovascular;  Laterality: N/A;    Current Medications: Current Meds    Medication Sig  . aspirin EC 81 MG tablet Take 81 mg by mouth daily.  Marland Kitchen atorvastatin (LIPITOR) 80 MG tablet Take 1 tablet (80 mg total) by mouth daily at 6 PM.  . empagliflozin (JARDIANCE) 10 MG TABS tablet Take 10 mg by mouth daily before breakfast.  . metFORMIN (GLUCOPHAGE) 500 MG tablet Take 1,000 mg by mouth daily.   . metoprolol tartrate (LOPRESSOR) 25 MG tablet Take 1 tablet (25 mg total) by mouth 2 (two) times daily.  . Multiple Vitamins-Minerals (MULTIVITAMIN WITH MINERALS) tablet Take 1 tablet by mouth daily.  . nitroGLYCERIN (NITROSTAT) 0.4 MG SL tablet Place 1 tablet (0.4 mg total) under the tongue every 5 (five) minutes x 3 doses as needed for chest pain.  . ticagrelor (BRILINTA) 90 MG TABS tablet Take 1 tablet (90 mg total) by mouth 2 (two) times daily.     Allergies:   Patient has no known allergies.   Social History   Socioeconomic History  . Marital status: Married    Spouse name: Not on file  . Number of children: Not on file  . Years of education: Not on file  . Highest education level: Not on file  Occupational History  . Not on file  Tobacco Use  . Smoking status: Never Smoker  . Smokeless tobacco: Never Used  Substance and Sexual Activity  . Alcohol use: Never  . Drug use: Never  . Sexual activity: Not on  file  Other Topics Concern  . Not on file  Social History Narrative  . Not on file   Social Determinants of Health   Financial Resource Strain:   . Difficulty of Paying Living Expenses:   Food Insecurity:   . Worried About Charity fundraiser in the Last Year:   . Arboriculturist in the Last Year:   Transportation Needs:   . Film/video editor (Medical):   Marland Kitchen Lack of Transportation (Non-Medical):   Physical Activity:   . Days of Exercise per Week:   . Minutes of Exercise per Session:   Stress:   . Feeling of Stress :   Social Connections:   . Frequency of Communication with Friends and Family:   . Frequency of Social Gatherings with  Friends and Family:   . Attends Religious Services:   . Active Member of Clubs or Organizations:   . Attends Archivist Meetings:   Marland Kitchen Marital Status:      Family History: The patient's family history includes Stroke (age of onset: 59) in her father. There is no history of Heart disease.  ROS:   Please see the history of present illness.    ROS  All other systems reviewed and negative.   EKGs/Labs/Other Studies Reviewed:    The following studies were reviewed today: none  EKG:  EKG is not ordered today.    Recent Labs: 06/27/2019: Magnesium 1.7 06/28/2019: BUN 13; Creatinine, Ser 0.69; Hemoglobin 12.2; Platelets 262; Potassium 3.8; Sodium 138   Recent Lipid Panel    Component Value Date/Time   CHOL 178 06/25/2019 0053   TRIG 103 06/25/2019 0053   HDL 60 06/25/2019 0053   CHOLHDL 3.0 06/25/2019 0053   VLDL 21 06/25/2019 0053   LDLCALC 97 06/25/2019 0053    Physical Exam:    VS:  BP 122/70   Pulse 66   Ht 5\' 1"  (1.549 m)   Wt 104 lb 3.2 oz (47.3 kg)   SpO2 98%   BMI 19.69 kg/m     Wt Readings from Last 3 Encounters:  07/13/19 104 lb 3.2 oz (47.3 kg)  06/28/19 101 lb 11.2 oz (46.1 kg)     GEN:  Well nourished, well developed in no acute distress HEENT: Normal NECK: No JVD; No carotid bruits LYMPHATICS: No lymphadenopathy CARDIAC: RRR, no murmurs, rubs, gallops RESPIRATORY:  Clear to auscultation without rales, wheezing or rhonchi  ABDOMEN: Soft, non-tender, non-distended MUSCULOSKELETAL:  No edema; No deformity  SKIN: Warm and dry NEUROLOGIC:  Alert and oriented x 3 PSYCHIATRIC:  Normal affect   ASSESSMENT:    1. Coronary artery disease involving native coronary artery of native heart with unstable angina pectoris ([place])   2. Essential hypertension   3. Pure hypercholesterolemia   4. DM type 2, goal HbA1c < 7% (HCC)   5. Cardiomyopathy, ischemic    PLAN:    In order of problems listed above:  1.  ASCAD -s/p NSTEMI with  cath showing  severe single vessel with 95% LAD with spasm and thrombus with distal embolization and underwent PCI of LAD with residual 30% mid LAD and ramus.  -she denies any anginal sx -continue ASA 81mg  daily and Brilinta 90mg  BID, high dose statin and BB.  2.  HTN -BP controlled -continue Lopressor 25mg  BID -restarrt Telmisartan 20mg  daily  3.  HLD -LDL goal < 70 -her LDL was 97 on admission -repeat FLP and ALT in 4 weeks  4.  DM2 -followed  by PCP -continue Metformin and Jardiance -restart low dose ARB with Telmisartan 20mg  daily  5.  Ischemic DCM -EF 40-45% post NSTEMI on echo -continue BB -restart low dose ARB -repeat 2D echo in 6 weeks   Medication Adjustments/Labs and Tests Ordered: Current medicines are reviewed at length with the patient today.  Concerns regarding medicines are outlined above.  No orders of the defined types were placed in this encounter.  No orders of the defined types were placed in this encounter.   Signed, , MD  07/13/2019 9:01 AM    [facility]

## 2019-07-15 ENCOUNTER — Encounter (HOSPITAL_COMMUNITY): Payer: Self-pay

## 2019-07-15 NOTE — Telephone Encounter (Signed)
Attempted to call patient in regards to Cardiac Rehab - LM on VM Mailed letter 

## 2019-08-01 ENCOUNTER — Telehealth (HOSPITAL_COMMUNITY): Payer: Self-pay

## 2019-08-01 NOTE — Telephone Encounter (Signed)
No response from pt regarding CR.  Closed referral.  

## 2019-08-26 ENCOUNTER — Other Ambulatory Visit: Payer: Self-pay

## 2019-08-26 ENCOUNTER — Ambulatory Visit (HOSPITAL_COMMUNITY): Payer: Medicare HMO | Attending: Cardiovascular Disease

## 2019-08-26 DIAGNOSIS — I255 Ischemic cardiomyopathy: Secondary | ICD-10-CM | POA: Insufficient documentation

## 2020-01-25 ENCOUNTER — Ambulatory Visit: Payer: Medicare HMO | Admitting: Cardiology

## 2020-02-17 ENCOUNTER — Ambulatory Visit: Payer: Medicare HMO | Admitting: Cardiology

## 2020-02-22 ENCOUNTER — Encounter: Payer: Self-pay | Admitting: Cardiology

## 2020-02-22 ENCOUNTER — Ambulatory Visit: Payer: Medicare HMO | Admitting: Cardiology

## 2020-02-22 ENCOUNTER — Other Ambulatory Visit: Payer: Self-pay

## 2020-02-22 VITALS — BP 126/65 | HR 69 | Ht 60.0 in | Wt 106.6 lb

## 2020-02-22 DIAGNOSIS — I255 Ischemic cardiomyopathy: Secondary | ICD-10-CM

## 2020-02-22 DIAGNOSIS — I10 Essential (primary) hypertension: Secondary | ICD-10-CM

## 2020-02-22 DIAGNOSIS — I25110 Atherosclerotic heart disease of native coronary artery with unstable angina pectoris: Secondary | ICD-10-CM | POA: Diagnosis not present

## 2020-02-22 DIAGNOSIS — E7800 Pure hypercholesterolemia, unspecified: Secondary | ICD-10-CM | POA: Diagnosis not present

## 2020-02-22 DIAGNOSIS — E119 Type 2 diabetes mellitus without complications: Secondary | ICD-10-CM

## 2020-02-22 NOTE — Patient Instructions (Signed)
Medication Instructions:  °Your physician recommends that you continue on your current medications as directed. Please refer to the Current Medication list given to you today. ° °*If you need a refill on your cardiac medications before your next appointment, please call your pharmacy* ° °Lab Work: °Fasting lipids and ALT  °If you have labs (blood work) drawn today and your tests are completely normal, you will receive your results only by: °• MyChart Message (if you have MyChart) OR °• A paper copy in the mail °If you have any lab test that is abnormal or we need to change your treatment, we will call you to review the results. ° °Follow-Up: °At [facility], you and your health needs are our priority.  As part of our continuing mission to provide you with exceptional heart care, we have created designated Provider Care Teams.  These Care Teams include your primary Cardiologist (physician) and Advanced Practice Providers (APPs -  Physician Assistants and Nurse Practitioners) who all work together to provide you with the care you need, when you need it. ° °We recommend signing up for the patient portal called "MyChart".  Sign up information is provided on this After Visit Summary.  MyChart is used to connect with patients for Virtual Visits (Telemedicine).  Patients are able to view lab/test results, encounter notes, upcoming appointments, etc.  Non-urgent messages can be sent to your provider as well.   °To learn more about what you can do with MyChart, go to [email or web address].   ° °Your next appointment:   °1 year(s) ° °The format for your next appointment:   °In Person ° °Provider:   °You may see Traci Turner, MD or one of the following Advanced Practice Providers on your designated Care Team:   °· Dayna Dunn, PA-C °· Michele Lenze, PA-C ° ° °

## 2020-02-22 NOTE — Progress Notes (Signed)
Cardiology Office Note:    Date:  02/22/2020   ID:  Alexandre Lightsey, DOB 05-Jun-1955, MRN [id number]  PCP:  Clemencia Course, PA-C  Cardiologist:  Armanda Magic, MD    Referring MD: Mellody Drown*   Chief Complaint  Patient presents with  . Coronary Artery Disease  . Hypertension  . Hyperlipidemia    History of Present Illness:    Holly Scott is a 64 y.o. female with a hx of hypertension andtype 2diabetes mellitus who presented to [facility] ED with chest pain and was found to NSTEMI.  She underwent cardiac cath showing severe single vessel with 95% LAD with spasm and thrombus with distal embolization and underwent PCI of LAD with residual 30% mid LAD and ramus. She is on DAPT with ASA and Brilinta.  2D echo showed mild LV dysfunction with EF 40-45% and mild to moderate MR.    After going home she had problems with Nausea and vomiting and stopped taking her metformin and Telmisartan and was seen by her PCP.  Her Nausea resolved and she is back on Metformin.  She was on Telmisartan prior to the MI with no problems and is now back on it.    She is here today for followup and is doing well.  She denies any chest pain or pressure, SOB, DOE, PND, orthopnea, LE edema, dizziness, palpitations or syncope. She is compliant with her meds and is tolerating meds with no SE.    Past Medical History:  Diagnosis Date  . Diabetes mellitus without complication (HCC)   . Hypertension     Past Surgical History:  Procedure Laterality Date  . CORONARY STENT INTERVENTION N/A 06/27/2019   Procedure: CORONARY STENT INTERVENTION;  Surgeon: Marykay Lex, MD;  Location: [facility] INVASIVE CV LAB;  Service: Cardiovascular;  Laterality: N/A;  . LEFT HEART CATH AND CORONARY ANGIOGRAPHY N/A 06/27/2019   Procedure: LEFT HEART CATH AND CORONARY ANGIOGRAPHY;  Surgeon: Marykay Lex, MD;  Location: [facility] INVASIVE CV LAB;  Service: Cardiovascular;  Laterality: N/A;     Current Medications: Current Meds  Medication Sig  . aspirin EC 81 MG tablet Take 81 mg by mouth daily.  Marland Kitchen atorvastatin (LIPITOR) 80 MG tablet Take 1 tablet (80 mg total) by mouth daily at 6 PM.  . JARDIANCE 10 MG TABS tablet Take 10 mg by mouth as directed.  . metFORMIN (GLUCOPHAGE) 500 MG tablet Take 1,000 mg by mouth daily.   . metoprolol tartrate (LOPRESSOR) 25 MG tablet Take 1 tablet (25 mg total) by mouth 2 (two) times daily.  . Multiple Vitamins-Minerals (MULTIVITAMIN WITH MINERALS) tablet Take 1 tablet by mouth daily.  . nitroGLYCERIN (NITROSTAT) 0.4 MG SL tablet Place 1 tablet (0.4 mg total) under the tongue every 5 (five) minutes x 3 doses as needed for chest pain.  . pioglitazone (ACTOS) 30 MG tablet Take 30 mg by mouth daily.   Marland Kitchen telmisartan (MICARDIS) 40 MG tablet Take 0.5 tablets (20 mg total) by mouth daily.  . ticagrelor (BRILINTA) 90 MG TABS tablet Take 1 tablet (90 mg total) by mouth 2 (two) times daily.     Allergies:   Patient has no known allergies.   Social History   Socioeconomic History  . Marital status: Married    Spouse name: Not on file  . Number of children: Not on file  . Years of education: Not on file  . Highest education level: Not on file  Occupational History  . Not on file  Tobacco Use  . Smoking status: Never Smoker  . Smokeless tobacco: Never Used  Vaping Use  . Vaping Use: Never used  Substance and Sexual Activity  . Alcohol use: Never  . Drug use: Never  . Sexual activity: Not on file  Other Topics Concern  . Not on file  Social History Narrative  . Not on file   Social Determinants of Health   Financial Resource Strain:   . Difficulty of Paying Living Expenses: Not on file  Food Insecurity:   . Worried About Programme researcher, broadcasting/film/video in the Last Year: Not on file  . Ran Out of Food in the Last Year: Not on file  Transportation Needs:   . Lack of Transportation (Medical): Not on file  . Lack of Transportation (Non-Medical): Not  on file  Physical Activity:   . Days of Exercise per Week: Not on file  . Minutes of Exercise per Session: Not on file  Stress:   . Feeling of Stress : Not on file  Social Connections:   . Frequency of Communication with Friends and Family: Not on file  . Frequency of Social Gatherings with Friends and Family: Not on file  . Attends Religious Services: Not on file  . Active Member of Clubs or Organizations: Not on file  . Attends Banker Meetings: Not on file  . Marital Status: Not on file     Family History: The patient's family history includes Stroke (age of onset: 14) in her father. There is no history of Heart disease.  ROS:   Please see the history of present illness.    ROS  All other systems reviewed and negative.   EKGs/Labs/Other Studies Reviewed:    The following studies were reviewed today: none  EKG:  EKG is not ordered today.    Recent Labs: 06/27/2019: Magnesium 1.7 06/28/2019: BUN 13; Creatinine, Ser 0.69; Hemoglobin 12.2; Platelets 262; Potassium 3.8; Sodium 138   Recent Lipid Panel    Component Value Date/Time   CHOL 178 06/25/2019 0053   TRIG 103 06/25/2019 0053   HDL 60 06/25/2019 0053   CHOLHDL 3.0 06/25/2019 0053   VLDL 21 06/25/2019 0053   LDLCALC 97 06/25/2019 0053    Physical Exam:    VS:  BP 126/65   Pulse 69   Ht 5' (1.524 m)   Wt 106 lb 9.6 oz (48.4 kg)   SpO2 99%   BMI 20.82 kg/m     Wt Readings from Last 3 Encounters:  02/22/20 106 lb 9.6 oz (48.4 kg)  07/13/19 104 lb 3.2 oz (47.3 kg)  06/28/19 101 lb 11.2 oz (46.1 kg)     GEN: Well nourished, well developed in no acute distress HEENT: Normal NECK: No JVD; No carotid bruits LYMPHATICS: No lymphadenopathy CARDIAC:RRR, no murmurs, rubs, gallops RESPIRATORY:  Clear to auscultation without rales, wheezing or rhonchi  ABDOMEN: Soft, non-tender, non-distended MUSCULOSKELETAL:  No edema; No deformity  SKIN: Warm and dry NEUROLOGIC:  Alert and oriented x  3 PSYCHIATRIC:  Normal affect    ASSESSMENT:    1. Coronary artery disease involving native coronary artery of native heart with unstable angina pectoris (HCC)   2. Essential hypertension   3. Pure hypercholesterolemia   4. DM type 2, goal HbA1c < 7% (HCC)   5. Cardiomyopathy, ischemic    PLAN:    In order of problems listed above:  1.  ASCAD -s/p NSTEMI with  cath showing severe single vessel with 95% LAD  with spasm and thrombus with distal embolization and underwent PCI of LAD with residual 30% mid LAD and ramus.  -she has not had any anginal symptoms -continue ASA 81mg  daily and Brilinta 90mg  BID, high dose statin and BB.  2.  HTN -BP well controlled on exam -continue Lopressor 25mg  BID and Telmisartan 20mg  daily -check BMET  3.  HLD -LDL goal < 70 -her LDL was 97 on admission -repeat FLP and ALT  -continue atorvastatin 80mg  daily  4.  DM2 -followed by PCP -continue Metformin and Jardiance -continue ARB  5.  Ischemic DCM -EF 40-45% post NSTEMI on echo -repeat echo 08/2019 on medical Rx showed normal LVF with EF 55-60% -continue BB and ARB   Medication Adjustments/Labs and Tests Ordered: Current medicines are reviewed at length with the patient today.  Concerns regarding medicines are outlined above.  No orders of the defined types were placed in this encounter.  No orders of the defined types were placed in this encounter.   Signed, , MD  02/22/2020 4:13 PM    [facility]

## 2020-02-28 ENCOUNTER — Telehealth: Payer: Self-pay | Admitting: Cardiology

## 2020-02-28 NOTE — Telephone Encounter (Signed)
Patient's daughter in law states the patient went to get her lab work done and was told her insurance may not cover her lipid panel. She would like to know what can be done for the patient.

## 2020-03-19 ENCOUNTER — Other Ambulatory Visit: Payer: Self-pay

## 2020-03-19 MED ORDER — TELMISARTAN 40 MG PO TABS: 20.0000 mg | ORAL_TABLET | Freq: Every day | ORAL | 3 refills | Status: DC

## 2020-10-26 ENCOUNTER — Emergency Department (HOSPITAL_BASED_OUTPATIENT_CLINIC_OR_DEPARTMENT_OTHER)
Admission: EM | Admit: 2020-10-26 | Discharge: 2020-10-26 | Disposition: A | Discharge status: Home / Self Care | Payer: Medicare HMO | Attending: Emergency Medicine | Admitting: Emergency Medicine

## 2020-10-26 ENCOUNTER — Encounter (HOSPITAL_BASED_OUTPATIENT_CLINIC_OR_DEPARTMENT_OTHER): Payer: Self-pay | Admitting: *Deleted

## 2020-10-26 ENCOUNTER — Emergency Department (HOSPITAL_BASED_OUTPATIENT_CLINIC_OR_DEPARTMENT_OTHER): Payer: Medicare HMO

## 2020-10-26 ENCOUNTER — Other Ambulatory Visit: Payer: Self-pay

## 2020-10-26 DIAGNOSIS — Z20822 Contact with and (suspected) exposure to covid-19: Secondary | ICD-10-CM | POA: Diagnosis not present

## 2020-10-26 DIAGNOSIS — I25110 Atherosclerotic heart disease of native coronary artery with unstable angina pectoris: Secondary | ICD-10-CM | POA: Insufficient documentation

## 2020-10-26 DIAGNOSIS — I10 Essential (primary) hypertension: Secondary | ICD-10-CM | POA: Diagnosis not present

## 2020-10-26 DIAGNOSIS — R5381 Other malaise: Secondary | ICD-10-CM | POA: Diagnosis not present

## 2020-10-26 DIAGNOSIS — E119 Type 2 diabetes mellitus without complications: Secondary | ICD-10-CM | POA: Diagnosis not present

## 2020-10-26 DIAGNOSIS — Z79899 Other long term (current) drug therapy: Secondary | ICD-10-CM | POA: Diagnosis not present

## 2020-10-26 DIAGNOSIS — R5383 Other fatigue: Secondary | ICD-10-CM | POA: Diagnosis not present

## 2020-10-26 DIAGNOSIS — Z7982 Long term (current) use of aspirin: Secondary | ICD-10-CM | POA: Diagnosis not present

## 2020-10-26 DIAGNOSIS — R509 Fever, unspecified: Secondary | ICD-10-CM

## 2020-10-26 DIAGNOSIS — Z7984 Long term (current) use of oral hypoglycemic drugs: Secondary | ICD-10-CM | POA: Diagnosis not present

## 2020-10-26 LAB — BASIC METABOLIC PANEL
Anion gap: 8 (ref 5–15)
BUN: 19 mg/dL (ref 8–23)
CO2: 23 mmol/L (ref 22–32)
Calcium: 8.8 mg/dL — ABNORMAL LOW (ref 8.9–10.3)
Chloride: 101 mmol/L (ref 98–111)
Creatinine, Ser: 0.82 mg/dL (ref 0.44–1.00)
GFR, Estimated: >60 mL/min (ref >60)
Glucose, Bld: 207 mg/dL — ABNORMAL HIGH (ref 70–99)
Potassium: 3.9 mmol/L (ref 3.5–5.1)
Sodium: 132 mmol/L — ABNORMAL LOW (ref 135–145)

## 2020-10-26 LAB — CBC WITH DIFFERENTIAL/PLATELET
Abs Immature Granulocytes: 0.14 10*3/uL — ABNORMAL HIGH (ref 0.00–0.07)
Basophils Absolute: 0 10*3/uL (ref 0.0–0.1)
Basophils Relative: 0 %
Eosinophils Absolute: 0 10*3/uL (ref 0.0–0.5)
Eosinophils Relative: 0 %
HCT: 36.4 % (ref 36.0–46.0)
Hemoglobin: 12 g/dL (ref 12.0–15.0)
Immature Granulocytes: 1 %
Lymphocytes Relative: 3 %
Lymphs Abs: 0.4 10*3/uL — ABNORMAL LOW (ref 0.7–4.0)
MCH: 29.1 pg (ref 26.0–34.0)
MCHC: 33 g/dL (ref 30.0–36.0)
MCV: 88.1 fL (ref 80.0–100.0)
Monocytes Absolute: 1.3 10*3/uL — ABNORMAL HIGH (ref 0.1–1.0)
Monocytes Relative: 9 %
Neutro Abs: 12.9 10*3/uL — ABNORMAL HIGH (ref 1.7–7.7)
Neutrophils Relative %: 87 %
Platelets: 203 10*3/uL (ref 150–400)
RBC: 4.13 MIL/uL (ref 3.87–5.11)
RDW: 14.6 % (ref 11.5–15.5)
WBC: 14.7 10*3/uL — ABNORMAL HIGH (ref 4.0–10.5)
nRBC: 0 % (ref 0.0–0.2)

## 2020-10-26 LAB — URINALYSIS, ROUTINE W REFLEX MICROSCOPIC
Bilirubin Urine: NEGATIVE
Glucose, UA: >=500 mg/dL — AB
Ketones, ur: 15 mg/dL — AB
Leukocytes,Ua: NEGATIVE
Nitrite: NEGATIVE
Protein, ur: NEGATIVE mg/dL
Specific Gravity, Urine: <1.005 — ABNORMAL LOW (ref 1.005–1.030)
pH: 5.5 (ref 5.0–8.0)

## 2020-10-26 LAB — URINALYSIS, MICROSCOPIC (REFLEX)

## 2020-10-26 LAB — LACTIC ACID, PLASMA: Lactic Acid, Venous: 1.1 mmol/L (ref 0.5–1.9)

## 2020-10-26 LAB — RESP PANEL BY RT-PCR (FLU A&B, COVID) ARPGX2
Influenza A by PCR: NEGATIVE
Influenza B by PCR: NEGATIVE
SARS Coronavirus 2 by RT PCR: NEGATIVE

## 2020-10-26 MED ORDER — SODIUM CHLORIDE 0.9 % IV BOLUS (SEPSIS)
1000.0000 mL | Freq: Once | INTRAVENOUS | Status: AC
  Administered 2020-10-26: 1000 mL via INTRAVENOUS

## 2020-10-26 MED ORDER — ACETAMINOPHEN 325 MG PO TABS
650.0000 mg | ORAL_TABLET | Freq: Once | ORAL | Status: AC
  Administered 2020-10-26: 650 mg via ORAL
  Filled 2020-10-26: qty 2

## 2020-10-26 MED ORDER — SODIUM CHLORIDE 0.9 % IV SOLN
1000.0000 mL | INTRAVENOUS | Status: DC
  Administered 2020-10-26: 1000 mL via INTRAVENOUS

## 2020-10-26 NOTE — ED Provider Notes (Signed)
[facility] EMERGENCY DEPARTMENT Provider Note   CSN: [id number] Arrival date & time: 10/26/20  1741     History Chief Complaint  Patient presents with   Fever    Holly Scott is a 65 y.o. female.   Fever  Patient presents to the ED with complaints of chills, malaise and fatigue.  Patient states she started having symptoms today.  She did feel fine yesterday.  She kept on feeling chilled and feverish.  She is not had any coughing.  Has not had any difficulty breathing.  No abdominal pain.  No dysuria.  No rashes.  Patient went to an urgent care and they told her oxygen level was low so she was sent to the ED.  Past Medical History:  Diagnosis Date   Diabetes mellitus without complication (HCC)    Hypertension     Patient Active Problem List   Diagnosis Date Noted   Coronary artery disease involving native coronary artery of native heart with unstable angina pectoris (HCC)    NSTEMI (non-ST elevated myocardial infarction) (HCC) 06/24/2019   Essential hypertension    DM type 2, goal HbA1c < 7% (HCC)     Past Surgical History:  Procedure Laterality Date   CORONARY STENT INTERVENTION N/A 06/27/2019   Procedure: CORONARY STENT INTERVENTION;  Surgeon: Marykay Lex, MD;  Location: [facility] INVASIVE CV LAB;  Service: Cardiovascular;  Laterality: N/A;   LEFT HEART CATH AND CORONARY ANGIOGRAPHY N/A 06/27/2019   Procedure: LEFT HEART CATH AND CORONARY ANGIOGRAPHY;  Surgeon: Marykay Lex, MD;  Location: [facility] INVASIVE CV LAB;  Service: Cardiovascular;  Laterality: N/A;     OB History   No obstetric history on file.     Family History  Problem Relation Age of Onset   Stroke Father 93   Heart disease Neg Hx     Social History   Tobacco Use   Smoking status: Never   Smokeless tobacco: Never  Vaping Use   Vaping Use: Never used  Substance Use Topics   Alcohol use: Never   Drug use: Never    Home Medications Prior to Admission medications   Medication  Sig Start Date End Date Taking? Authorizing Provider  aspirin EC 81 MG tablet Take 81 mg by mouth daily.    [provider]  atorvastatin (LIPITOR) 80 MG tablet Take 1 tablet (80 mg total) by mouth daily at 6 PM. 06/28/19 06/27/20  Berton Bon, NP  JARDIANCE 10 MG TABS tablet Take 10 mg by mouth as directed. 01/02/20   [provider]  metFORMIN (GLUCOPHAGE) 500 MG tablet Take 1,000 mg by mouth daily.     [provider]  metoprolol tartrate (LOPRESSOR) 25 MG tablet Take 1 tablet (25 mg total) by mouth 2 (two) times daily. 06/28/19 06/27/20  Berton Bon, NP  Multiple Vitamins-Minerals (MULTIVITAMIN WITH MINERALS) tablet Take 1 tablet by mouth daily.    [provider]  nitroGLYCERIN (NITROSTAT) 0.4 MG SL tablet Place 1 tablet (0.4 mg total) under the tongue every 5 (five) minutes x 3 doses as needed for chest pain. 06/28/19   Berton Bon, NP  pioglitazone (ACTOS) 30 MG tablet Take 30 mg by mouth daily.  02/14/20   [provider]  telmisartan (MICARDIS) 40 MG tablet Take 0.5 tablets (20 mg total) by mouth daily. 03/19/20   Quintella Reichert, MD    Allergies    Patient has no known allergies.  Review of Systems   Review of Systems  Constitutional:  Positive for fever.  All other systems reviewed and are negative.  Physical Exam Updated Vital Signs BP (!) 101/49   Pulse 90   Temp 99 F (37.2 C)   Resp 16   Ht 1.549 m (5\' 1" )   Wt 47.2 kg   SpO2 99%   BMI 19.65 kg/m   Physical Exam Vitals and nursing note reviewed.  Constitutional:      General: She is not in acute distress.    Appearance: She is well-developed.  HENT:     Head: Normocephalic and atraumatic.     Right Ear: External ear normal.     Left Ear: External ear normal.  Eyes:     General: No scleral icterus.       Right eye: No discharge.        Left eye: No discharge.     Conjunctiva/sclera: Conjunctivae normal.  Neck:     Trachea: No tracheal deviation.   Cardiovascular:     Rate and Rhythm: Normal rate and regular rhythm.  Pulmonary:     Effort: Pulmonary effort is normal. No respiratory distress.     Breath sounds: Normal breath sounds. No stridor. No wheezing or rales.  Abdominal:     General: Bowel sounds are normal. There is no distension.     Palpations: Abdomen is soft.     Tenderness: There is no abdominal tenderness. There is no guarding or rebound.  Musculoskeletal:        General: No tenderness or deformity.     Cervical back: Neck supple.  Skin:    General: Skin is warm and dry.     Findings: No rash.  Neurological:     General: No focal deficit present.     Mental Status: She is alert.     Cranial Nerves: No cranial nerve deficit (no facial droop, extraocular movements intact, no slurred speech).     Sensory: No sensory deficit.     Motor: No abnormal muscle tone or seizure activity.     Coordination: Coordination normal.  Psychiatric:        Mood and Affect: Mood normal.    ED Results / Procedures / Treatments   Labs (all labs ordered are listed, but only abnormal results are displayed) Labs Reviewed  CBC WITH DIFFERENTIAL/PLATELET - Abnormal; Notable for the following components:      Result Value   WBC 14.7 (*)    Neutro Abs 12.9 (*)    Lymphs Abs 0.4 (*)    Monocytes Absolute 1.3 (*)    Abs Immature Granulocytes 0.14 (*)    All other components within normal limits  BASIC METABOLIC PANEL - Abnormal; Notable for the following components:   Sodium 132 (*)    Glucose, Bld 207 (*)    Calcium 8.8 (*)    All other components within normal limits  URINALYSIS, ROUTINE W REFLEX MICROSCOPIC - Abnormal; Notable for the following components:   Specific Gravity, Urine <1.005 (*)    Glucose, UA >=500 (*)    Hgb urine dipstick TRACE (*)    Ketones, ur 15 (*)    All other components within normal limits  URINALYSIS, MICROSCOPIC (REFLEX) - Abnormal; Notable for the following components:   Bacteria, UA RARE (*)     All other components within normal limits  RESP PANEL BY RT-PCR (FLU A&B, COVID) [id number]  LACTIC ACID, PLASMA    EKG None  Radiology DG Chest 2 View  Result Date: 10/26/2020 CLINICAL DATA:  Fever EXAM:  CHEST - 2 VIEW COMPARISON:  06/24/2019 FINDINGS: Heart size normal. Left coronary stent. Negative for heart failure. Lungs clear without infiltrate or effusion. IMPRESSION: No active cardiopulmonary disease. Electronically Signed   By: Marlan Palau M.D.   On: 10/26/2020 18:35    Procedures Procedures   Medications Ordered in ED Medications  sodium chloride 0.9 % bolus 1,000 mL (0 mLs Intravenous Stopped 10/26/20 2134)    Followed by  0.9 %  sodium chloride infusion (1,000 mLs Intravenous New Bag/Given 10/26/20 2208)  acetaminophen (TYLENOL) tablet 650 mg (650 mg Oral Given 10/26/20 1758)    ED Course  I have reviewed the triage vital signs and the nursing notes.  Pertinent labs & imaging results that were available during my care of the patient were reviewed by me and considered in my medical decision making (see chart for details).  Clinical Course as of 10/26/20 2300  Fri Oct 26, 2020  2001 Covid and flu are negative.  Chest x-ray without pneumonia [JK]  2116 CBC does show elevated white blood cell count.  Metabolic panel does show elevated blood sugar lactic acid levels normal [JK]  2116 Chest x-ray without acute findings [JK]  2242 Urinalysis is negative.  No signs of infection [JK]    Clinical Course User Index [JK] Linwood Dibbles, MD   MDM Rules/Calculators/A&P                          Patient presented to the ED for evaluation of fevers chills concerns for possible pneumonia.  According to the patient's husband the patient had a low oxygen saturation and fever at the urgent care.  Patient's oxygen saturation has been normal here.  She did have borderline low blood pressure but she is not showing any signs of sepsis.  Her lactic acid level is normal.  She does not have a  leukocytosis.  X-ray does not show pneumonia.  COVID and flu are negative.  She is not having any abdominal pain or rash to suggest other etiologies.  Patient was given IV fluids.  She is feeling well.  Suspect symptoms may be related to a viral illness.  At this time she appears stable for discharge.  Warning signs and precautions discussed.  Follow-up with PCP if symptoms persist Final Clinical Impression(s) / ED Diagnoses Final diagnoses:  Fever, unspecified fever cause    Rx / DC Orders ED Discharge Orders     None        Linwood Dibbles, MD 10/26/20 2300

## 2020-10-26 NOTE — Discharge Instructions (Addendum)
Take Tylenol or ibuprofen as needed for fever aches and pains.  Follow-up with your doctor next week to be rechecked.  Return as needed for worsening symptoms

## 2020-10-26 NOTE — ED Triage Notes (Signed)
C/o  of fever, chills, weakness, 1 episode of emesis that started today, sent here from [facility]

## 2021-03-26 ENCOUNTER — Other Ambulatory Visit: Payer: Self-pay | Admitting: Cardiology

## 2021-03-28 ENCOUNTER — Other Ambulatory Visit: Payer: Self-pay | Admitting: Cardiology

## 2021-04-05 IMAGING — CR DG CHEST 2V
2 series · 2 of 2 positions shown · non-contrast
Comparison: None.

CLINICAL DATA: Chest pain. Pain for 2 days.

EXAM:
CHEST - 2 VIEW

[w chest pa]
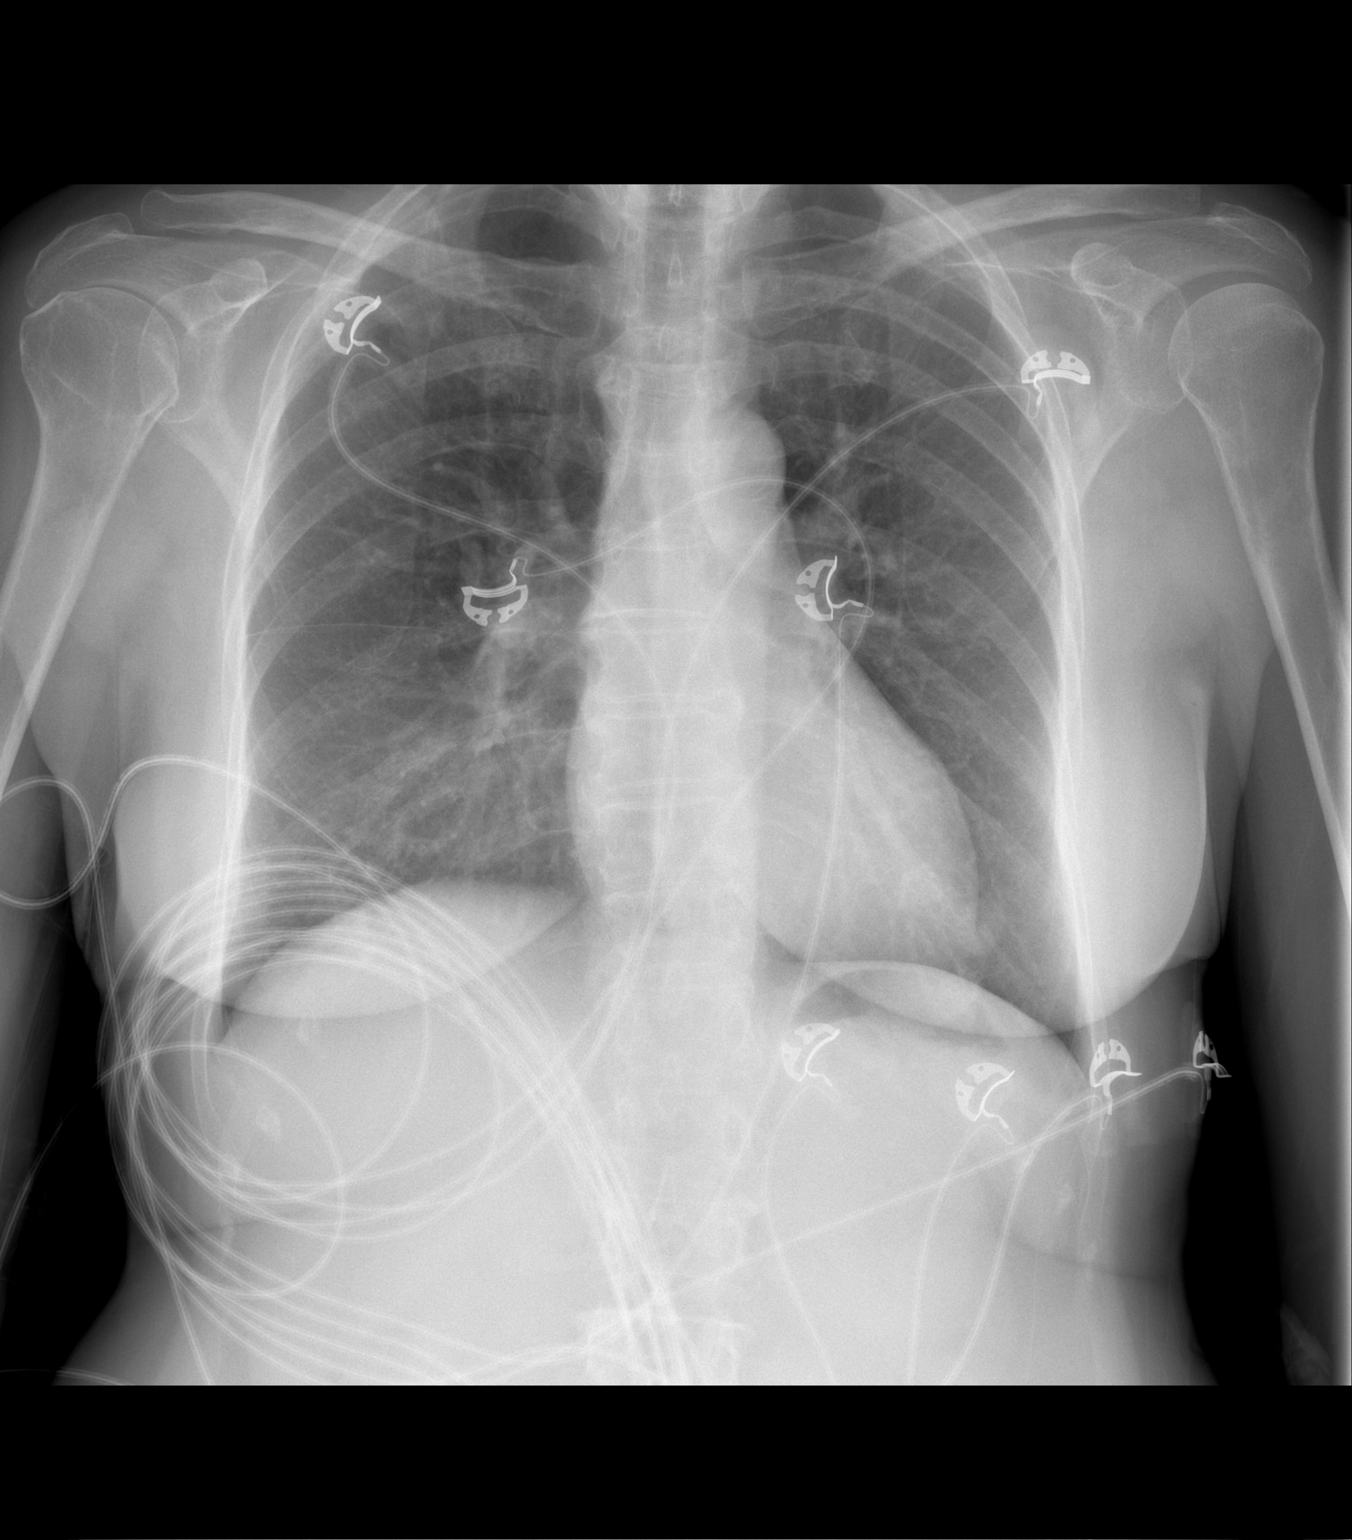

[w chest lat]
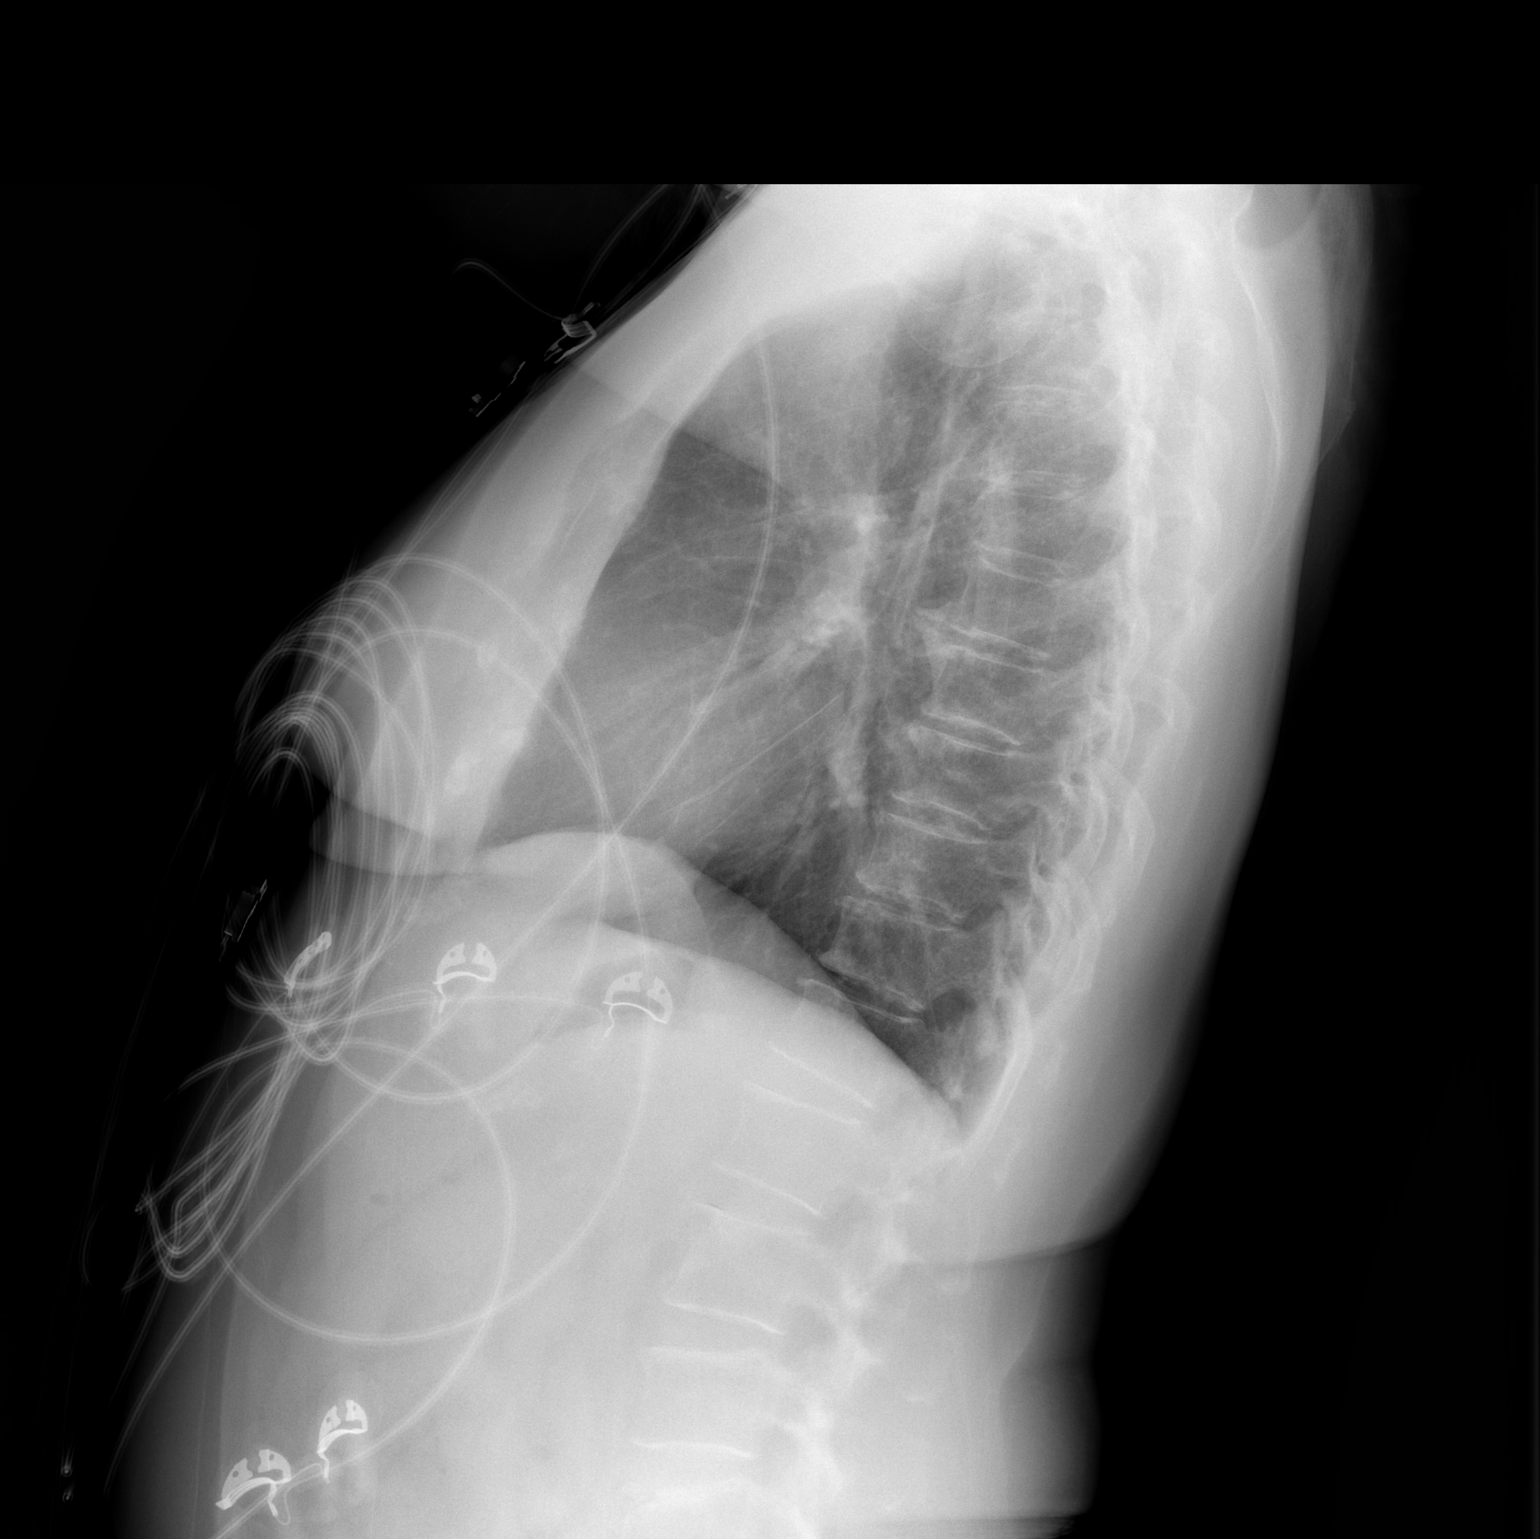

[2 of 2 positions shown; findings below may reference images not displayed]

FINDINGS: The cardiomediastinal contours are normal. The lungs are clear.
Pulmonary vasculature is normal. No consolidation, pleural effusion,
or pneumothorax. No acute osseous abnormalities are seen.
IMPRESSION: Negative radiographs of the chest.

## 2021-06-20 ENCOUNTER — Ambulatory Visit: Payer: Medicare HMO | Admitting: Cardiology

## 2021-08-05 NOTE — Progress Notes (Signed)
?Cardiology Office Note:   ? ?Date:  08/07/2021  ? ?ID:  Holly Scott, DOB [birth year], MRN [id number] ? ?PCP:  Holly Scott, Holly Frazier, PA-C ?  ?[facility] Providers ?Cardiologist:  Holly Magicraci Turner, MD    ? ?Referring MD: Holly Scott, Holly Frazie*  ? ?Chief Complaint: follow-up CAD ? ?History of Present Illness:   ? ?Holly Scott is a 66 y.o. female with a hx of CAD s/p DES to LAD x 1, hypertension, diabetes, and  hyperlipidemia. ? ?She presented to [facility] on 06/24/19 with chest pain described as "burning" and associated with n/v. Troponin was elevated at 391 ? 659 and EKG revealed minimal ST elevation in V1 and V2 but did not meet STEMI criteria. [facility] she underwent LHC which revealed severe single vessel with 95% LAD with spasm and thrombus with distal embolization, successful PCI of LAD with residual 30% mid LAD and ramus. Echo revealed LVEF 40-45% post NSTEMI. Repeat echo 08/2019 revealed normal LVEF at 55-60% on medical therapy.  ? ?She was last seen in our office on 02/22/2020 by Dr. Mayford Scott at which time 1 year follow-up was recommended. ? ?Today, she is here with daughter-in-law who is interpreting for her.  Patient reports she was seen in urgent care approximately 1 month ago for chest pain.  Pain is located along the left rib cage and across to mid sternum.  She felt that it was worse after bending forward and picking something up.  She takes care of of a young grandson and is frequently carrying him around.  Feels that pain is getting better.  Does not feel like her angina equivalent. No increased pain with exertion, no dyspnea.  Daughter-in-law states that she pushes the patient to walk for exercise but that she complains of leg pain after walking for a short time.  Physically active at home doing housework and childcare, cooking but does not exercise on a regular basis. She denies lower extremity edema, fatigue, palpitations, melena, hematuria, hemoptysis,  diaphoresis, weakness, presyncope, syncope, orthopnea, and PND. ? ? ?Past Medical History:  ?Diagnosis Date  ? Diabetes mellitus without complication (HCC)   ? Hypertension   ? ? ?Past Surgical History:  ?Procedure Laterality Date  ? CORONARY STENT INTERVENTION N/A 06/27/2019  ? Procedure: CORONARY STENT INTERVENTION;  Surgeon: Marykay LexHarding, David W, MD;  [facility];  Service: Cardiovascular;  Laterality: N/A;  ? LEFT HEART CATH AND CORONARY ANGIOGRAPHY N/A 06/27/2019  ? Procedure: LEFT HEART CATH AND CORONARY ANGIOGRAPHY;  Surgeon: Marykay LexHarding, David W, MD;  [facility];  Service: Cardiovascular;  Laterality: N/A;  ? ? ?Current Medications: ?Current Meds  ?Medication Sig  ? aspirin EC 81 MG tablet Take 81 mg by mouth daily.  ? BRILINTA 90 MG TABS tablet Take 90 mg by mouth 2 (two) times daily.  ? metFORMIN (GLUCOPHAGE) 500 MG tablet Take 1,000 mg by mouth daily.   ? Multiple Vitamins-Minerals (MULTIVITAMIN WITH MINERALS) tablet Take 1 tablet by mouth daily.  ? pioglitazone (ACTOS) 30 MG tablet Take 30 mg by mouth daily.   ? [DISCONTINUED] atorvastatin (LIPITOR) 80 MG tablet Take 1 tablet (80 mg total) by mouth daily at 6 PM.  ? [DISCONTINUED] JARDIANCE 10 MG TABS tablet Take 10 mg by mouth as directed.  ? [DISCONTINUED] metoprolol tartrate (LOPRESSOR) 25 MG tablet Take 1 tablet (25 mg total) by mouth 2 (two) times daily.  ? [DISCONTINUED] nitroGLYCERIN (NITROSTAT) 0.4 MG SL tablet Place 1 tablet (0.4 mg total)  under the tongue every 5 (five) minutes x 3 doses as needed for chest pain.  ? [DISCONTINUED] telmisartan (MICARDIS) 40 MG tablet Take 0.5 tablets (20 mg total) by mouth daily. Please make overdue appt with Dr. Mayford Knife before anymore refills. Thank you 1st attempt  ?  ? ?Allergies:   Patient has no known allergies.  ? ?Social History  ? ?Socioeconomic History  ? Marital status: Married  ?  Spouse name: Not on file  ? Number of children: Not on file  ? Years of education: Not on file  ?  Highest education level: Not on file  ?Occupational History  ? Not on file  ?Tobacco Use  ? Smoking status: Never  ? Smokeless tobacco: Never  ?Vaping Use  ? Vaping Use: Never used  ?Substance and Sexual Activity  ? Alcohol use: Never  ? Drug use: Never  ? Sexual activity: Not on file  ?Other Topics Concern  ? Not on file  ?Social History Narrative  ? Not on file  ? ?Social Determinants of Health  ? ?Financial Resource Strain: Not on file  ?Food Insecurity: Not on file  ?Transportation Needs: Not on file  ?Physical Activity: Not on file  ?Stress: Not on file  ?Social Connections: Not on file  ?  ? ?Family History: ?The patient's family history includes Stroke (age of onset: 69) in her father. There is no history of Heart disease. ? ?ROS:   ?Please see the history of present illness.    ?+abdominal, left side pain ?All other systems reviewed and are negative. ? ?Labs/Other Studies Reviewed:   ? ?The following studies were reviewed today: ? ?Echo 08/26/19 ? ?Left Ventricle: Left ventricular ejection fraction, by estimation, is 55  ?to 60%. The left ventricle has normal function. The left ventricle has no  ?regional wall motion abnormalities. The left ventricular internal cavity  ?size was normal in size. There is  ?no left ventricular hypertrophy. Left ventricular diastolic parameters  ?were normal. Indeterminate filling pressures.  ?Right Ventricle: The right ventricular size is normal. No increase in  ?right ventricular wall thickness. Right ventricular systolic function is  ?normal.  ?Left Atrium: Left atrial size was normal in size.  ?Right Atrium: Right atrial size was normal in size.  ?Pericardium: There is no evidence of pericardial effusion.  ?Mitral Valve: The mitral valve is normal in structure. Normal mobility of  ?the mitral valve leaflets. No evidence of mitral valve regurgitation. No  ?evidence of mitral valve stenosis.  ?Tricuspid Valve: The tricuspid valve is normal in structure. Tricuspid  ?valve  regurgitation is not demonstrated. No evidence of tricuspid  ?stenosis.  ?Aortic Valve: The aortic valve is normal in structure. Aortic valve  ?regurgitation is not visualized. No aortic stenosis is present.  ?Pulmonic Valve: The pulmonic valve was normal in structure. Pulmonic valve  ?regurgitation is not visualized. No evidence of pulmonic stenosis.  ?Aorta: The aortic root is normal in size and structure.  ?Venous: The inferior vena cava is normal in size with greater than 50%  ?respiratory variability, suggesting right atrial pressure of 3 mmHg.  ?IAS/Shunts: No atrial level shunt detected by color flow Doppler.  ? ? ?LHC 06/27/19 ? ?CULPRIT LESION: prox LAD to Mid LAD lesion is 95% stenosed. ?A drug-eluting stent was successfully placed (covering an area distal to the lesion that was accidentally treated with the balloon because of watermelon seeding) using a SYNERGY XD 4.03K74. ?The stent was postdilated with a tapering from 3.1 2.9 mm. ?Post intervention, there  is a 0% residual stenosis. ?Mid LAD lesion is 30% stenosed. ?Ramus lesion is 30% stenosed. ?------------------ ?There is mild left ventricular systolic dysfunction. The left ventricular ejection fraction is 45-50% by visual estimate. LV end diastolic pressure is normal. ?  ?SUMMARY ?Severe single-vessel disease with proximal LAD 95% lesion that had component of spasm and likely thrombus with distal embolization. ?Successful DES PCI covering the lesion segment as well as a slight segment downstream due to concern for possible balloon related dissection. ->  Resolute Onyx DES 2.7 mm x 38 mm-- >postdilated 3.1 mm tapered to 2.9 mm. ?Brief episode of no reflow--post PCI TIMI-3 flow up to the apex and TIMI II flow around.  (Likely distal embolization) ?Chest pain-free upon leaving Cath Lab. 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 ?Mildly reduced EF with mild anterior hypokinesis but otherwise relatively preserved.  Normal EDP. ? ? ? ?Recent Labs: ?From   Care Everywhere 07/01/20 ?A1C 9.5 ?  ?01/02/21 ?TSH 1.21, Creatinine 0.74, Na+ 139, K+ 4.5, AST 24, ALT 23 ?Recent Lipid Panel ?LDL 40 ? ? ?Risk Assessment/Calculations:   ?  ? ? ?Physical Exam:   ? ?VS:  BP (!) 11

## 2021-08-07 ENCOUNTER — Ambulatory Visit: Payer: Medicare Other | Admitting: Nurse Practitioner

## 2021-08-07 ENCOUNTER — Encounter: Payer: Self-pay | Admitting: Nurse Practitioner

## 2021-08-07 VITALS — BP 110/52 | HR 70 | Ht 60.0 in | Wt 104.0 lb

## 2021-08-07 DIAGNOSIS — E785 Hyperlipidemia, unspecified: Secondary | ICD-10-CM

## 2021-08-07 DIAGNOSIS — I2510 Atherosclerotic heart disease of native coronary artery without angina pectoris: Secondary | ICD-10-CM

## 2021-08-07 DIAGNOSIS — I10 Essential (primary) hypertension: Secondary | ICD-10-CM | POA: Diagnosis not present

## 2021-08-07 DIAGNOSIS — R0989 Other specified symptoms and signs involving the circulatory and respiratory systems: Secondary | ICD-10-CM

## 2021-08-07 DIAGNOSIS — R079 Chest pain, unspecified: Secondary | ICD-10-CM

## 2021-08-07 MED ORDER — METOPROLOL TARTRATE 25 MG PO TABS: 25.0000 mg | ORAL_TABLET | Freq: Two times a day (BID) | ORAL | 11 refills | Status: DC

## 2021-08-07 MED ORDER — JARDIANCE 10 MG PO TABS: 10.0000 mg | ORAL_TABLET | ORAL | 11 refills | Status: DC

## 2021-08-07 MED ORDER — TELMISARTAN 40 MG PO TABS: 20.0000 mg | ORAL_TABLET | Freq: Every day | ORAL | 11 refills | Status: DC

## 2021-08-07 MED ORDER — ATORVASTATIN CALCIUM 80 MG PO TABS: 80.0000 mg | ORAL_TABLET | Freq: Every day | ORAL | 11 refills | Status: DC

## 2021-08-07 MED ORDER — NITROGLYCERIN 0.4 MG SL SUBL: 0.4000 mg | SUBLINGUAL_TABLET | SUBLINGUAL | 3 refills | Status: AC | PRN

## 2021-08-07 NOTE — Patient Instructions (Signed)
Medication Instructions:  ? ?Your physician recommends that you continue on your current medications as directed. Please refer to the Current Medication list given to you today. ? ? ?*If you need a refill on your cardiac medications before your next appointment, please call your pharmacy* ?have any lab test that is abnormal or we need to change your treatment, we will call you to review the results. ? ? ?Testing/Procedures: ? ?Your physician has requested that you have a carotid duplex. This test is an ultrasound of the carotid arteries in your neck. It looks at blood flow through these arteries that supply the brain with blood. Allow one hour for this exam. There are no restrictions or special instructions. ? ? ?Follow-Up: ?At [facility], you and your health needs are our priority.  As part of our continuing mission to provide you with exceptional heart care, we have created designated Provider Care Teams.  These Care Teams include your primary Cardiologist (physician) and Advanced Practice Providers (APPs -  Physician Assistants and Nurse Practitioners) who all work together to provide you with the care you need, when you need it. ? ?We recommend signing up for the patient portal called "MyChart".  Sign up information is provided on this After Visit Summary.  MyChart is used to connect with patients for Virtual Visits (Telemedicine).  Patients are able to view lab/test results, encounter notes, upcoming appointments, etc.  Non-urgent messages can be sent to your provider as well.   ?To learn more about what you can do with MyChart, go to [email or web address].   ? ?Your next appointment:   ?1 year(s) ? ?The format for your next appointment:   ?In Person ? ?Provider:   ?Armanda Magic, MD   ? ? ?Important Information About Sugar ? ? ? ? ?  ?

## 2021-08-16 ENCOUNTER — Ambulatory Visit (HOSPITAL_COMMUNITY)
Admission: RE | Admit: 2021-08-16 | Discharge: 2021-08-16 | Disposition: A | Discharge status: Home / Self Care | Payer: Medicare Other | Source: Ambulatory Visit | Attending: Cardiovascular Disease | Admitting: Cardiovascular Disease

## 2021-08-16 DIAGNOSIS — I10 Essential (primary) hypertension: Secondary | ICD-10-CM | POA: Insufficient documentation

## 2021-08-16 DIAGNOSIS — R0989 Other specified symptoms and signs involving the circulatory and respiratory systems: Secondary | ICD-10-CM | POA: Insufficient documentation

## 2021-08-16 DIAGNOSIS — E785 Hyperlipidemia, unspecified: Secondary | ICD-10-CM | POA: Insufficient documentation

## 2021-08-16 DIAGNOSIS — I2510 Atherosclerotic heart disease of native coronary artery without angina pectoris: Secondary | ICD-10-CM | POA: Insufficient documentation

## 2022-06-24 ENCOUNTER — Other Ambulatory Visit: Payer: Self-pay | Admitting: Nurse Practitioner

## 2022-09-01 ENCOUNTER — Telehealth: Payer: Self-pay | Admitting: Cardiology

## 2022-09-01 NOTE — Telephone Encounter (Signed)
Patient has not been seen in over a year and is over due for follow-up. Patient's daughter-in-law is complaining of patient having edeme in BLE for the last 5 days. She stated it is hard for patient to walk and it is painful. Patient is not currently on any diuretic. Since patient has not been seen in over a year made her an appointment to see Eligha Bridegroom NP who saw her last. Also, made her an appointment with Dr. Mayford Knife next available in October.

## 2022-09-01 NOTE — Telephone Encounter (Signed)
Pt c/o swelling: STAT is pt has developed SOB within 24 hours  How much weight have you gained and in what time span? Unsure  If swelling, where is the swelling located? Legs  Are you currently taking a fluid pill? No  Are you currently SOB? No  Do you have a log of your daily weights (if so, list)? No  Have you gained 3 pounds in a day or 5 pounds in a week? Unsure  Have you traveled recently? No  Patient's daughter stated that the patient started having her legs swell about 5 days ago. Patient's daughter is requesting we call her work phone number.

## 2022-09-02 NOTE — Progress Notes (Unsigned)
Cardiology Office Note:    Date:  09/03/2022   ID:  Holly Scott, DOB 1956/03/24, MRN [id number]  PCP:  Clemencia Course, PA-C   [facility] Providers Cardiologist:  Armanda Magic, MD     Referring MD: Mellody Drown*   Chief Complaint: follow-up CAD  History of Present Illness:    Holly Scott is a 67 y.o. female with a hx of CAD s/p DES to LAD x 1, hypertension, diabetes, and  hyperlipidemia.  She presented to [facility] on 06/24/19 with chest pain described as "burning" and associated with n/v. Troponin was elevated at 391 ? 659 and EKG revealed minimal ST elevation in V1 and V2 but did not meet STEMI criteria. Transferred to [facility] where she underwent LHC which revealed severe single vessel with 95% LAD with spasm and thrombus with distal embolization, successful PCI of LAD with residual 30% mid LAD and ramus. Echo revealed LVEF 40-45% post NSTEMI. Repeat echo 08/2019 revealed normal LVEF at 55-60% on medical therapy.   Seen in cardiology clinic by me on 08/07/21 with daughter-in-law who is interpreting for her. Seen in urgent care 1 month prior for chest pain. Identifies pain along the left rib cage and across to mid sternum. Felt that it was worse after bending forward and picking something up. Cares for her young grandson and is frequently carrying him around. Feels that pain is getting better. Does not feel like her angina equivalent. No increased pain with exertion, no dyspnea.  Daughter-in-law states that she pushes the patient to walk for exercise but that she complains of leg pain after walking for a short time.  Physically active at home doing housework and childcare, cooking but does not exercise on a regular basis. No lower extremity edema, fatigue, palpitations, melena, hematuria, hemoptysis, diaphoresis, weakness, presyncope, syncope, orthopnea, and PND. Carotid duplex was ordered for bruit and revealed no stenosis bilaterally.   Today, she is  here for one year follow-up with her daughter-in-law whom accompanies her to all appointments. Reports patient has been concerned about having chills on several occasions and about leg swelling. Started insulin last month and thinks symptoms are caused by insulin. They have not checked her temperature. She had normal blood counts in March. Leg swelling started approximately one month ago.  She reports it is uncomfortable.  She does not elevate her legs consistently. Says that she does not eat high sodium diet but daughter-in-law says that the son that she lives with generally eat high sodium foods. Patient is a vegetarian, she is concerned she does not get enough protein.  She takes care of a young grandchild but does not exercise regularly.  Patient denies chest pain, shortness of breath, palpitations, orthopnea, PND, presyncope, syncope. Does not monitor weight consistently. Also has bilateral knee pain, right worse than left.  Wonders if pain and swelling in lower extremities is related to arthritis.  Past Medical History:  Diagnosis Date   Diabetes mellitus without complication (HCC)    Hypertension     Past Surgical History:  Procedure Laterality Date   CORONARY STENT INTERVENTION N/A 06/27/2019   Procedure: CORONARY STENT INTERVENTION;  Surgeon: Marykay Lex, MD;  Location[facility] INVASIVE CV LAB;  Service: Cardiovascular;  Laterality: N/A;   LEFT HEART CATH AND CORONARY ANGIOGRAPHY N/A 06/27/2019   Procedure: LEFT HEART CATH AND CORONARY ANGIOGRAPHY;  Surgeon: Marykay Lex, MD;  Location: [facility] INVASIVE CV LAB;  Service: Cardiovascular;  Laterality: N/A;    Current Medications: Current Meds  Medication Sig   aspirin EC 81 MG tablet Take 81 mg by mouth daily.   BD PEN NEEDLE NANO 2ND GEN 32G X 4 MM MISC Use 1 time daily   furosemide (LASIX) 20 MG tablet Take one (1) tablet by mouth ( 20 mg) X 3 days than daily as needed for swelling.   insulin degludec (TRESIBA) 100 UNIT/ML FlexTouch Pen  Use as directed. Max daily dose is 20 units.   JARDIANCE 10 MG TABS tablet Take 1 tablet (10 mg total) by mouth as directed.   metFORMIN (GLUCOPHAGE) 500 MG tablet Take 1,000 mg by mouth daily.    Multiple Vitamin (QUINTABS) TABS Take 1 tablet by mouth daily.   Multiple Vitamins-Minerals (MULTIVITAMIN WITH MINERALS) tablet Take 1 tablet by mouth daily.   nitroGLYCERIN (NITROSTAT) 0.4 MG SL tablet Place 1 tablet (0.4 mg total) under the tongue every 5 (five) minutes x 3 doses as needed for chest pain.   pioglitazone (ACTOS) 30 MG tablet Take 30 mg by mouth daily.    potassium chloride (KLOR-CON M) 10 MEQ tablet Take one (1) tablet by mouth ( 10 mEq) daily X 3 days than as needed for swelling.   [DISCONTINUED] atorvastatin (LIPITOR) 80 MG tablet TAKE 1 TABLET(80 MG) BY MOUTH DAILY AT 6 PM   [DISCONTINUED] BRILINTA 90 MG TABS tablet Take 90 mg by mouth 2 (two) times daily.     Allergies:   Patient has no known allergies.   Social History   Socioeconomic History   Marital status: Married    Spouse name: Not on file   Number of children: Not on file   Years of education: Not on file   Highest education level: Not on file  Occupational History   Not on file  Tobacco Use   Smoking status: Never   Smokeless tobacco: Never  Vaping Use   Vaping Use: Never used  Substance and Sexual Activity   Alcohol use: Never   Drug use: Never   Sexual activity: Not on file  Other Topics Concern   Not on file  Social History Narrative   Not on file   Social Determinants of Health   Financial Resource Strain: Not on file  Food Insecurity: Not on file  Transportation Needs: Not on file  Physical Activity: Not on file  Stress: Not on file  Social Connections: Not on file     Family History: The patient's family history includes Stroke (age of onset: 55) in her father. There is no history of Heart disease.  ROS:   Please see the history of present illness.    + bilateral LE nonpitting  edema All other systems reviewed and are negative.  Labs/Other Studies Reviewed:    The following studies were reviewed today:  Carotid Duplex 08/17/21 Summary:  Right Carotid: There is no evidence of stenosis in the right ICA.                 Non-hemodynamically significant plaque <50% noted in the  CCA. The                 extracranial vessels were near-normal with only minimal  wall                thickening or plaque.   Left Carotid: There is no evidence of stenosis in the left ICA. The  extracranial               vessels were near-normal with only minimal wall thickening  or               plaque.   Vertebrals:  Bilateral vertebral arteries demonstrate antegrade flow.  Small              caliber left vertebral artery.  Subclavians: Normal flow hemodynamics were seen in bilateral subclavian               arteries.   *See table(s) above for measurements and observations.  Echo 08/26/19  Left Ventricle: Left ventricular ejection fraction, by estimation, is 55  to 60%. The left ventricle has normal function. The left ventricle has no  regional wall motion abnormalities. The left ventricular internal cavity  size was normal in size. There is  no left ventricular hypertrophy. Left ventricular diastolic parameters  were normal. Indeterminate filling pressures.  Right Ventricle: The right ventricular size is normal. No increase in  right ventricular wall thickness. Right ventricular systolic function is  normal.  Left Atrium: Left atrial size was normal in size.  Right Atrium: Right atrial size was normal in size.  Pericardium: There is no evidence of pericardial effusion.  Mitral Valve: The mitral valve is normal in structure. Normal mobility of  the mitral valve leaflets. No evidence of mitral valve regurgitation. No  evidence of mitral valve stenosis.  Tricuspid Valve: The tricuspid valve is normal in structure. Tricuspid  valve regurgitation is not demonstrated. No  evidence of tricuspid  stenosis.  Aortic Valve: The aortic valve is normal in structure. Aortic valve  regurgitation is not visualized. No aortic stenosis is present.  Pulmonic Valve: The pulmonic valve was normal in structure. Pulmonic valve  regurgitation is not visualized. No evidence of pulmonic stenosis.  Aorta: The aortic root is normal in size and structure.  Venous: The inferior vena cava is normal in size with greater than 50%  respiratory variability, suggesting right atrial pressure of 3 mmHg.  IAS/Shunts: No atrial level shunt detected by color flow Doppler.    LHC 06/27/19  CULPRIT LESION: prox LAD to Mid LAD lesion is 95% stenosed. A drug-eluting stent was successfully placed (covering an area distal to the lesion that was accidentally treated with the balloon because of watermelon seeding) using a SYNERGY XD 1.61W96. The stent was postdilated with a tapering from 3.1 2.9 mm. Post intervention, there is a 0% residual stenosis. Mid LAD lesion is 30% stenosed. Ramus lesion is 30% stenosed. ------------------ There is mild left ventricular systolic dysfunction. The left ventricular ejection fraction is 45-50% by visual estimate. LV end diastolic pressure is normal.   SUMMARY Severe single-vessel disease with proximal LAD 95% lesion that had component of spasm and likely thrombus with distal embolization. Successful DES PCI covering the lesion segment as well as a slight segment downstream due to concern for possible balloon related dissection. ->  Resolute Onyx DES 2.7 mm x 38 mm-- >postdilated 3.1 mm tapered to 2.9 mm. Brief episode of no reflow--post PCI TIMI-3 flow up to the apex and TIMI II flow around.  (Likely distal embolization) Chest pain-free upon leaving Cath Lab. 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 Mildly reduced EF with mild anterior hypokinesis but otherwise relatively preserved.  Normal EDP.    Recent Labs: From  Care Everywhere 07/01/20 A1C 9.5    01/02/21 TSH 1.21, Creatinine 0.74, Na+ 139, K+ 4.5, AST 24, ALT 23 Recent Lipid Panel LDL 40   Risk Assessment/Calculations:       Physical Exam:    VS:  BP (!) 112/50  Pulse 87   Ht 5' (1.524 m)   Wt 107 lb 12.8 oz (48.9 kg)   SpO2 95%   BMI 21.05 kg/m     Wt Readings from Last 3 Encounters:  09/03/22 107 lb 12.8 oz (48.9 kg)  08/07/21 104 lb (47.2 kg)  10/26/20 104 lb (47.2 kg)     GEN:  Well nourished, well developed in no acute distress HEENT: Normal NECK: No JVD; No carotid bruits CARDIAC: RRR, no murmurs, rubs, gallops RESPIRATORY:  Clear to auscultation without rales, wheezing or rhonchi  ABDOMEN: Soft, non-tender, non-distended MUSCULOSKELETAL:  Bilateral non-pitting LE edema; No deformity. 2+ pedal pulses, equal bilaterally SKIN: Warm and dry NEUROLOGIC:  Alert and oriented x 3 PSYCHIATRIC:  Normal affect   EKG:  EKG is ordered today and reveals normal sinus rhythm at 87 bpm, nonspecific T wave abnormality, no acute change from previous tracing  Diagnoses:    1. Leg swelling   2. Bilateral carotid bruits   3. Coronary artery disease involving native coronary artery of native heart without angina pectoris   4. Essential hypertension   5. Hyperlipidemia LDL goal <70     Assessment and Plan:     Leg swelling: Concern for recent increase in leg swelling. She denies shortness of breath, dyspnea, orthopnea, PND. She does not weigh consistently but no evidence of weight gain. She denies indiscretion with sodium but admits she likely does not eat enough protein. Also having knee pain, worse in right. Does not exercise on a consistent basis but daughter states pt cannot walk a few feet from her home to her daughter's. She appears euvolemic on exam with the exception of bilateral nonpitting LE edema. We will update echo for evaluation of heart and valve function. Will have her take Lasix 20 mg and potassium for 3 days and then as needed for weight gain or  swelling. Encouraged leg elevation and low sodium diet. Will check BMP in 2 weeks.   CAD without angina: History of PCI/DES to prox LAD 06/2019. She denies chest pain, dyspnea, or other symptoms concerning for angina.  No indication for further ischemic evaluation at this time. Advised increased physical activity to achieve 150 minutes of moderate exercise each week.  No bleeding problems.  Continue aspirin, atorvastatin, metoprolol, telmisartan.  Hypertension: BP well-controlled.  Hyperlipidemia LDL goal < 70: LDL 22 on 06/19/22, well controlled. Continue atorvastatin.  Diabetes: A1C 9.4 on 06/17/22. Recently started on insulin but is not monitoring FSBG consistently. Encouraged regular monitoring. Management per PCP.    Disposition: 6 months with Dr. Mayford Knife or me  Medication Adjustments/Labs and Tests Ordered: Current medicines are reviewed at length with the patient today.  Concerns regarding medicines are outlined above.  Orders Placed This Encounter  Procedures   Basic Metabolic Panel (BMET)   EKG 12-Lead   ECHOCARDIOGRAM COMPLETE   Meds ordered this encounter  Medications   atorvastatin (LIPITOR) 80 MG tablet    Sig: TAKE 1 TABLET(80 MG) BY MOUTH DAILY AT 6 PM    Dispense:  90 tablet    Refill:  3   BRILINTA 90 MG TABS tablet    Sig: Take 1 tablet (90 mg total) by mouth 2 (two) times daily.    Dispense:  180 tablet    Refill:  3   metoprolol tartrate (LOPRESSOR) 25 MG tablet    Sig: Take 1 tablet (25 mg total) by mouth 2 (two) times daily.    Dispense:  90 tablet    Refill:  3   telmisartan (MICARDIS) 40 MG tablet    Sig: Take 0.5 tablets (20 mg total) by mouth daily.    Dispense:  45 tablet    Refill:  3   furosemide (LASIX) 20 MG tablet    Sig: Take one (1) tablet by mouth ( 20 mg) X 3 days than daily as needed for swelling.    Dispense:  90 tablet    Refill:  3   potassium chloride (KLOR-CON M) 10 MEQ tablet    Sig: Take one (1) tablet by mouth ( 10 mEq) daily X 3 days  than as needed for swelling.    Dispense:  90 tablet    Refill:  3    Patient Instructions  Medication Instructions:   START Lasix one (1) tablet by mouth ( 20 mg) daily X 3 days than as needed for leg swelling.    START K-dur one (1) tablet by mouth ( 10 mEq) daily X 3 days than as needed for leg swelling.   *If you need a refill on your cardiac medications before your next appointment, please call your pharmacy*   Lab Work:  Your physician recommends that you return for lab work in: 2 weeks in [employer] at med center or any lab corp.  Paperwork given to patient today.    If you have labs (blood work) drawn today and your tests are completely normal, you will receive your results only by: MyChart Message (if you have MyChart) OR A paper copy in the mail If you have any lab test that is abnormal or we need to change your treatment, we will call you to review the results.   Testing/Procedures:  Your physician has requested that you have an echocardiogram. Echocardiography is a painless test that uses sound waves to create images of your heart. It provides your doctor with information about the size and shape of your heart and how well your heart's chambers and valves are working. This procedure takes approximately one hour. There are no restrictions for this procedure. On the first floor at the imaging department.   Please do NOT wear cologne, perfume, or lotions (deodorant is allowed). Please arrive 15 minutes prior to your appointment time.    Follow-Up: At [facility], you and your health needs are our priority.  As part of our continuing mission to provide you with exceptional heart care, we have created designated Provider Care Teams.  These Care Teams include your primary Cardiologist (physician) and Advanced Practice Providers (APPs -  Physician Assistants and Nurse Practitioners) who all work together to provide you with the care you need, when you need  it.  We recommend signing up for the patient portal called "MyChart".  Sign up information is provided on this After Visit Summary.  MyChart is used to connect with patients for Virtual Visits (Telemedicine).  Patients are able to view lab/test results, encounter notes, upcoming appointments, etc.  Non-urgent messages can be sent to your provider as well.   To learn more about what you can do with MyChart, go to [email or web address].    Your next appointment:   5 month(s)  Provider:   Armanda Magic, MD       Signed, Levi Aland, NP  09/03/2022 4:51 PM    [place]

## 2022-09-03 ENCOUNTER — Encounter: Payer: Self-pay | Admitting: Nurse Practitioner

## 2022-09-03 ENCOUNTER — Ambulatory Visit: Payer: 59 | Attending: Nurse Practitioner | Admitting: Nurse Practitioner

## 2022-09-03 VITALS — BP 112/50 | HR 87 | Ht 60.0 in | Wt 107.8 lb

## 2022-09-03 DIAGNOSIS — I10 Essential (primary) hypertension: Secondary | ICD-10-CM

## 2022-09-03 DIAGNOSIS — E119 Type 2 diabetes mellitus without complications: Secondary | ICD-10-CM

## 2022-09-03 DIAGNOSIS — Z7984 Long term (current) use of oral hypoglycemic drugs: Secondary | ICD-10-CM

## 2022-09-03 DIAGNOSIS — I2510 Atherosclerotic heart disease of native coronary artery without angina pectoris: Secondary | ICD-10-CM

## 2022-09-03 DIAGNOSIS — E785 Hyperlipidemia, unspecified: Secondary | ICD-10-CM

## 2022-09-03 DIAGNOSIS — M7989 Other specified soft tissue disorders: Secondary | ICD-10-CM

## 2022-09-03 MED ORDER — ATORVASTATIN CALCIUM 80 MG PO TABS: ORAL_TABLET | ORAL | 3 refills | Status: DC

## 2022-09-03 MED ORDER — POTASSIUM CHLORIDE CRYS ER 10 MEQ PO TBCR: EXTENDED_RELEASE_TABLET | ORAL | 3 refills | Status: DC

## 2022-09-03 MED ORDER — TELMISARTAN 40 MG PO TABS: 20.0000 mg | ORAL_TABLET | Freq: Every day | ORAL | 3 refills | Status: DC

## 2022-09-03 MED ORDER — BRILINTA 90 MG PO TABS: 90.0000 mg | ORAL_TABLET | Freq: Two times a day (BID) | ORAL | 3 refills | Status: DC

## 2022-09-03 MED ORDER — FUROSEMIDE 20 MG PO TABS: ORAL_TABLET | ORAL | 3 refills | Status: DC

## 2022-09-03 MED ORDER — METOPROLOL TARTRATE 25 MG PO TABS: 25.0000 mg | ORAL_TABLET | Freq: Two times a day (BID) | ORAL | 3 refills | Status: DC

## 2022-09-03 NOTE — Patient Instructions (Addendum)
Medication Instructions:   START Lasix one (1) tablet by mouth ( 20 mg) daily X 3 days than as needed for leg swelling.    START K-dur one (1) tablet by mouth ( 10 mEq) daily X 3 days than as needed for leg swelling.   *If you need a refill on your cardiac medications before your next appointment, please call your pharmacy*   Lab Work:  Your physician recommends that you return for lab work in: 2 weeks in [employer] at med center or any lab corp.  Paperwork given to patient today.    If you have labs (blood work) drawn today and your tests are completely normal, you will receive your results only by: MyChart Message (if you have MyChart) OR A paper copy in the mail If you have any lab test that is abnormal or we need to change your treatment, we will call you to review the results.   Testing/Procedures:  Your physician has requested that you have an echocardiogram. Echocardiography is a painless test that uses sound waves to create images of your heart. It provides your doctor with information about the size and shape of your heart and how well your heart's chambers and valves are working. This procedure takes approximately one hour. There are no restrictions for this procedure. On the first floor at the imaging department.   Please do NOT wear cologne, perfume, or lotions (deodorant is allowed). Please arrive 15 minutes prior to your appointment time.    Follow-Up: At [facility], you and your health needs are our priority.  As part of our continuing mission to provide you with exceptional heart care, we have created designated Provider Care Teams.  These Care Teams include your primary Cardiologist (physician) and Advanced Practice Providers (APPs -  Physician Assistants and Nurse Practitioners) who all work together to provide you with the care you need, when you need it.  We recommend signing up for the patient portal called "MyChart".  Sign up information is provided  on this After Visit Summary.  MyChart is used to connect with patients for Virtual Visits (Telemedicine).  Patients are able to view lab/test results, encounter notes, upcoming appointments, etc.  Non-urgent messages can be sent to your provider as well.   To learn more about what you can do with MyChart, go to [email or web address].    Your next appointment:   5 month(s)  Provider:   Armanda Magic, MD

## 2022-09-30 ENCOUNTER — Ambulatory Visit (HOSPITAL_BASED_OUTPATIENT_CLINIC_OR_DEPARTMENT_OTHER)
Admission: RE | Admit: 2022-09-30 | Discharge: 2022-09-30 | Disposition: A | Discharge status: Home / Self Care | Payer: 59 | Source: Ambulatory Visit | Attending: Nurse Practitioner | Admitting: Nurse Practitioner

## 2022-09-30 DIAGNOSIS — I10 Essential (primary) hypertension: Secondary | ICD-10-CM | POA: Diagnosis present

## 2022-09-30 DIAGNOSIS — I2510 Atherosclerotic heart disease of native coronary artery without angina pectoris: Secondary | ICD-10-CM | POA: Diagnosis present

## 2022-09-30 DIAGNOSIS — E785 Hyperlipidemia, unspecified: Secondary | ICD-10-CM | POA: Diagnosis present

## 2022-09-30 DIAGNOSIS — M7989 Other specified soft tissue disorders: Secondary | ICD-10-CM

## 2022-10-01 LAB — ECHOCARDIOGRAM COMPLETE
Area-P 1/2: 4.49 cm2
S' Lateral: 2.6 cm

## 2022-10-07 ENCOUNTER — Other Ambulatory Visit: Payer: Self-pay | Admitting: *Deleted

## 2022-10-07 DIAGNOSIS — I10 Essential (primary) hypertension: Secondary | ICD-10-CM

## 2022-10-07 DIAGNOSIS — I2510 Atherosclerotic heart disease of native coronary artery without angina pectoris: Secondary | ICD-10-CM

## 2022-10-07 DIAGNOSIS — E785 Hyperlipidemia, unspecified: Secondary | ICD-10-CM

## 2022-10-07 DIAGNOSIS — M7989 Other specified soft tissue disorders: Secondary | ICD-10-CM

## 2022-10-07 MED ORDER — FUROSEMIDE 20 MG PO TABS: ORAL_TABLET | ORAL | 3 refills | Status: DC

## 2022-10-07 MED ORDER — POTASSIUM CHLORIDE CRYS ER 10 MEQ PO TBCR: EXTENDED_RELEASE_TABLET | ORAL | 3 refills | Status: DC

## 2022-11-18 ENCOUNTER — Telehealth: Payer: Self-pay | Admitting: Cardiology

## 2022-11-18 NOTE — Telephone Encounter (Signed)
   Pre-operative Risk Assessment    Patient Name: Holly Scott  DOB: 06-01-1955 MRN: [id number]     Request for Surgical Clearance    Procedure:  Dental Extraction - Amount of Teeth to be Pulled:  2  Date of Surgery:  Clearance TBD                                 Surgeon:  Dr.  Festus Aloe Group or Practice Name:   [facility] Phone number:  [phone]  Fax number:  [phone]   Type of Clearance Requested:   - Medical  - Pharmacy:  Hold blood thinner      Type of Anesthesia:  Local    Additional requests/questions:   Caller stated Novocaine will be anesthesia used.  Signed, Annetta Maw   11/18/2022, 10:51 AM

## 2022-11-18 NOTE — Telephone Encounter (Signed)
   Patient Name: Holly Scott  DOB: [birth year] MRN: [id number]  Primary Cardiologist: Armanda Magic, MD  Chart reviewed as part of pre-operative protocol coverage.   Dental extractions of 1-2 teeth are considered low risk procedures per guidelines and generally do not require any specific cardiac clearance. It is also generally accepted that for extractions of 1-2 teeth and dental cleanings, there is no need to interrupt blood thinner therapy.  SBE prophylaxis is not required for the patient from a cardiac standpoint.  I will route this recommendation to the requesting party via Epic fax function and remove from pre-op pool.  Please call with questions.  Carlos Levering, NP 11/18/2022, 4:13 PM

## 2022-12-30 ENCOUNTER — Encounter (HOSPITAL_BASED_OUTPATIENT_CLINIC_OR_DEPARTMENT_OTHER): Payer: Self-pay

## 2022-12-30 ENCOUNTER — Emergency Department (HOSPITAL_BASED_OUTPATIENT_CLINIC_OR_DEPARTMENT_OTHER)
Admission: EM | Admit: 2022-12-30 | Discharge: 2022-12-30 | Disposition: A | Discharge status: Home / Self Care | Payer: 59 | Attending: Emergency Medicine | Admitting: Emergency Medicine

## 2022-12-30 ENCOUNTER — Other Ambulatory Visit: Payer: Self-pay

## 2022-12-30 DIAGNOSIS — E119 Type 2 diabetes mellitus without complications: Secondary | ICD-10-CM | POA: Insufficient documentation

## 2022-12-30 DIAGNOSIS — Z79899 Other long term (current) drug therapy: Secondary | ICD-10-CM | POA: Diagnosis not present

## 2022-12-30 DIAGNOSIS — M7989 Other specified soft tissue disorders: Secondary | ICD-10-CM | POA: Diagnosis present

## 2022-12-30 DIAGNOSIS — Z7984 Long term (current) use of oral hypoglycemic drugs: Secondary | ICD-10-CM | POA: Diagnosis not present

## 2022-12-30 DIAGNOSIS — Z7982 Long term (current) use of aspirin: Secondary | ICD-10-CM | POA: Diagnosis not present

## 2022-12-30 DIAGNOSIS — Z794 Long term (current) use of insulin: Secondary | ICD-10-CM | POA: Diagnosis not present

## 2022-12-30 DIAGNOSIS — L03116 Cellulitis of left lower limb: Secondary | ICD-10-CM | POA: Diagnosis not present

## 2022-12-30 DIAGNOSIS — I10 Essential (primary) hypertension: Secondary | ICD-10-CM | POA: Diagnosis not present

## 2022-12-30 LAB — CBG MONITORING, ED: Glucose-Capillary: 156 mg/dL — ABNORMAL HIGH (ref 70–99)

## 2022-12-30 MED ORDER — SODIUM CHLORIDE 0.9 % IV SOLN
1.0000 g | Freq: Once | INTRAVENOUS | Status: AC
  Administered 2022-12-30: 1 g via INTRAVENOUS
  Filled 2022-12-30: qty 10

## 2022-12-30 MED ORDER — SODIUM CHLORIDE 0.9 % IV SOLN: INTRAVENOUS | Status: DC | PRN

## 2022-12-30 MED ORDER — CEPHALEXIN 500 MG PO CAPS: 500.0000 mg | ORAL_CAPSULE | Freq: Four times a day (QID) | ORAL | 0 refills | Status: DC

## 2022-12-30 NOTE — ED Provider Notes (Signed)
[facility] Provider Note   CSN: [id number] Arrival date & time: 12/30/22  1853     History  Chief Complaint  Patient presents with   Leg Swelling    Holly Scott is a 67 y.o. female.  Patient with history of T2DM, HTN, HLD presents for treatment of foot infection. Seen by her PCP earlier today for evaluation of a painful blister on the left foot that has developed, opened and drained over the last 3 days, with subsequent swelling and redness of the foot and lower leg. No fever. She was told by her PCP she needed IV antibiotics.   The history is provided by the patient and a relative. No language interpreter was used.       Home Medications Prior to Admission medications   Medication Sig Start Date End Date Taking? Authorizing Provider  cephALEXin (KEFLEX) 500 MG capsule Take 1 capsule (500 mg total) by mouth 4 (four) times daily. 12/30/22  Yes Rolondo Pierre, Melvenia Beam, PA-C  aspirin EC 81 MG tablet Take 81 mg by mouth daily.    [provider]  atorvastatin (LIPITOR) 80 MG tablet TAKE 1 TABLET(80 MG) BY MOUTH DAILY AT 6 PM 09/03/22   Swinyer, Zachary George, NP  BD PEN NEEDLE NANO 2ND GEN 32G X 4 MM MISC Use 1 time daily 06/17/22   [provider]  BRILINTA 90 MG TABS tablet Take 1 tablet (90 mg total) by mouth 2 (two) times daily. 09/03/22   Swinyer, Zachary George, NP  furosemide (LASIX) 20 MG tablet Take one (1) tablet by mouth ( 20 mg) daily.  Can take an additional tablet (20 mg) daily if does not see improvement on swelling. 10/07/22   Swinyer, Zachary George, NP  insulin degludec (TRESIBA) 100 UNIT/ML FlexTouch Pen Use as directed. Max daily dose is 20 units. 06/17/22   [provider]  JARDIANCE 10 MG TABS tablet Take 1 tablet (10 mg total) by mouth as directed. 08/07/21   Swinyer, Zachary George, NP  metFORMIN (GLUCOPHAGE) 500 MG tablet Take 1,000 mg by mouth daily.     [provider]  metoprolol tartrate  (LOPRESSOR) 25 MG tablet Take 1 tablet (25 mg total) by mouth 2 (two) times daily. 09/03/22 09/03/23  Swinyer, Zachary George, NP  Multiple Vitamin Brandt Loosen) TABS Take 1 tablet by mouth daily. 07/10/17   [provider]  Multiple Vitamins-Minerals (MULTIVITAMIN WITH MINERALS) tablet Take 1 tablet by mouth daily.    [provider]  nitroGLYCERIN (NITROSTAT) 0.4 MG SL tablet Place 1 tablet (0.4 mg total) under the tongue every 5 (five) minutes x 3 doses as needed for chest pain. 08/07/21   Swinyer, Zachary George, NP  pioglitazone (ACTOS) 30 MG tablet Take 30 mg by mouth daily.  02/14/20   [provider]  potassium chloride (KLOR-CON M) 10 MEQ tablet Take one (1) tablet by mouth ( 10 mEq) daily. 10/07/22   Swinyer, Zachary George, NP  telmisartan (MICARDIS) 40 MG tablet Take 0.5 tablets (20 mg total) by mouth daily. 09/03/22 09/04/23  Swinyer, Zachary George, NP      Allergies    Patient has no known allergies.    Review of Systems   Review of Systems  Physical Exam Updated Vital Signs BP (!) 123/53 (BP Location: Left Arm)   Pulse 88   Temp 98.6 F (37 C)   Resp 18   Ht 5' (1.524 m)   Wt 48.1 kg   SpO2 99%  BMI 20.70 kg/m  Physical Exam Vitals and nursing note reviewed.  Constitutional:      General: She is not in acute distress.    Appearance: Normal appearance. She is not ill-appearing.  Pulmonary:     Effort: Pulmonary effort is normal.  Musculoskeletal:        General: Swelling present. Normal range of motion.  Skin:    General: Skin is warm and dry.     Findings: Erythema present.     Comments: Opened partial thickness wound to lateral left forefoot. No active drainage. There is mild erythema surrounding the wound extending to the ankle and distal lower leg. There is mild swelling. Warm to touch. No calf tenderness.   Neurological:     Mental Status: She is alert and oriented to person, place, and time.     ED Results / Procedures / Treatments   Labs (all labs  ordered are listed, but only abnormal results are displayed) Labs Reviewed  CBG MONITORING, ED - Abnormal; Notable for the following components:      Result Value   Glucose-Capillary 156 (*)    All other components within normal limits   Results for orders placed or performed during the hospital encounter of 12/30/22 (from the past 24 hour(s))  CBG monitoring, ED     Status: Abnormal   Collection Time: 12/30/22  7:25 PM  Result Value Ref Range   Glucose-Capillary 156 (H) 70 - 99 mg/dL   Comment 1 Notify RN      EKG None  Radiology No results found.  Procedures Procedures    Medications Ordered in ED Medications  0.9 %  sodium chloride infusion ( Intravenous New Bag/Given 12/30/22 2120)  cefTRIAXone (ROCEPHIN) 1 g in sodium chloride 0.9 % 100 mL IVPB (1 g Intravenous New Bag/Given 12/30/22 2121)    ED Course/ Medical Decision Making/ A&P Clinical Course as of 12/30/22 2153  Tue Dec 30, 2022  2112 She went to [facility] earlier but felt the wait was too long. Labs were drawn and chart review shows the results: no leukocytosis. No electrolyte abnormalities. Glucose mildly elevated here at 156. Do not feel she will require admission. IV Rocephin provided in ED. Will discharge home with Keflex x 10 days. PCP close follow up in 2 days. Return precautions discussed.  [SU]    Clinical Course User Index [SU] Elpidio Anis, PA-C                                 Medical Decision Making Risk Prescription drug management.           Final Clinical Impression(s) / ED Diagnoses Final diagnoses:  Cellulitis of left lower extremity    Rx / DC Orders ED Discharge Orders          Ordered    cephALEXin (KEFLEX) 500 MG capsule  4 times daily        12/30/22 2148              Elpidio Anis, PA-C 12/30/22 2153    Glyn Ade, MD 12/30/22 2302

## 2022-12-30 NOTE — Discharge Instructions (Signed)
Take Keflex as prescribed over the next 10 days.   Follow up with your doctor closely for recheck to ensure improvement. If there are any worsening symptoms - fever, increasing pain/swelling/redness - return to the ED for further management.

## 2022-12-30 NOTE — ED Triage Notes (Addendum)
Blister to left foot x 3 days, popped, now c/o same foot swelling. Seen by podiatrist, was sent to ED at [facility], got labs but left before treatment. Was called and told she would need IV antibiotics. BG 156 in triage.

## 2023-02-10 ENCOUNTER — Encounter: Payer: Self-pay | Admitting: Cardiology

## 2023-02-10 ENCOUNTER — Ambulatory Visit: Payer: 59 | Attending: Cardiology | Admitting: Cardiology

## 2023-02-10 VITALS — BP 112/58 | HR 67 | Ht 60.0 in | Wt 111.0 lb

## 2023-02-10 DIAGNOSIS — I10 Essential (primary) hypertension: Secondary | ICD-10-CM

## 2023-02-10 DIAGNOSIS — E785 Hyperlipidemia, unspecified: Secondary | ICD-10-CM | POA: Diagnosis not present

## 2023-02-10 DIAGNOSIS — E119 Type 2 diabetes mellitus without complications: Secondary | ICD-10-CM

## 2023-02-10 DIAGNOSIS — I255 Ischemic cardiomyopathy: Secondary | ICD-10-CM

## 2023-02-10 DIAGNOSIS — I2510 Atherosclerotic heart disease of native coronary artery without angina pectoris: Secondary | ICD-10-CM

## 2023-02-10 MED ORDER — TICAGRELOR 60 MG PO TABS: 60.0000 mg | ORAL_TABLET | Freq: Two times a day (BID) | ORAL | 12 refills | Status: DC

## 2023-02-10 MED ORDER — TICAGRELOR 60 MG PO TABS: 60.0000 mg | ORAL_TABLET | Freq: Two times a day (BID) | ORAL | 3 refills | Status: AC

## 2023-02-10 NOTE — Progress Notes (Signed)
Cardiology Office Note:    Date:  02/10/2023   ID:  Elton Sossamon, DOB [birth year], MRN [id number]  PCP:  Clemencia Course, PA-C  Cardiologist:  Armanda Magic, MD    Referring MD: Mellody Drown*   Chief Complaint  Patient presents with   Coronary Artery Disease   Hypertension   Hyperlipidemia   Cardiomyopathy    History of Present Illness:    Holly Scott is a 67 y.o. female with a hx of hypertension, type 2 diabetes mellitus and ASCAD s/p remote NSTEMI in 2021 with cardiac cath showing severe single vessel with 95% LAD with spasm and thrombus with distal embolization s/p PCI of LAD with residual 30% mid LAD and ramus. 2D echo showed mild LV dysfunction with EF 40-45% and mild to moderate MR.    She was last seen by Clair Gulling on 09/03/2022 and was complaining of leg swelling.  She was euvolemic on exam except for some bilateral nonpitting edema.  She was started on Lasix 20 mg for 3 days and then as needed for weight gain and lower extremity edema.  She was not having any problems with chest pain.  2D echo was updated which revealed EF 60 to 65% with G2DD and no valvular heart disease.  She is here today for followup and is doing well.  She denies any chest pain or pressure, SOB, DOE, PND, orthopnea, dizziness, palpitations or syncope. She does have chronic LE edema that is sporadic.  She admits to using table salt.She is compliant with her meds and is tolerating meds with no SE.    Past Medical History:  Diagnosis Date   CAD (coronary artery disease), native coronary artery 2021   s/p NSTEMI; cath 2021 with 95% LAD with spasm and thrombus with distal embolization s/p PCI of LAD with residual 30% mid LAD and ramus   Diabetes mellitus without complication (HCC)    HLD (hyperlipidemia)    Hypertension     Past Surgical History:  Procedure Laterality Date   CORONARY STENT INTERVENTION N/A 06/27/2019   Procedure: CORONARY STENT INTERVENTION;   Surgeon: Marykay Lex, MD;  Location: [facility] INVASIVE CV LAB;  Service: Cardiovascular;  Laterality: N/A;   LEFT HEART CATH AND CORONARY ANGIOGRAPHY N/A 06/27/2019   Procedure: LEFT HEART CATH AND CORONARY ANGIOGRAPHY;  Surgeon: Marykay Lex, MD;  Location: [facility] INVASIVE CV LAB;  Service: Cardiovascular;  Laterality: N/A;    Current Medications: Current Meds  Medication Sig   aspirin EC 81 MG tablet Take 81 mg by mouth daily.   atorvastatin (LIPITOR) 80 MG tablet TAKE 1 TABLET(80 MG) BY MOUTH DAILY AT 6 PM   BD PEN NEEDLE NANO 2ND GEN 32G X 4 MM MISC Use 1 time daily   BRILINTA 90 MG TABS tablet Take 1 tablet (90 mg total) by mouth 2 (two) times daily.   cephALEXin (KEFLEX) 500 MG capsule Take 1 capsule (500 mg total) by mouth 4 (four) times daily.   furosemide (LASIX) 20 MG tablet Take one (1) tablet by mouth ( 20 mg) daily.  Can take an additional tablet (20 mg) daily if does not see improvement on swelling.   insulin degludec (TRESIBA) 100 UNIT/ML FlexTouch Pen Use as directed. Max daily dose is 20 units.   JARDIANCE 10 MG TABS tablet Take 1 tablet (10 mg total) by mouth as directed.   metFORMIN (GLUCOPHAGE) 500 MG tablet Take 1,000 mg by mouth daily.    metoprolol tartrate (LOPRESSOR) 25 MG tablet Take  1 tablet (25 mg total) by mouth 2 (two) times daily.   Multiple Vitamin (QUINTABS) TABS Take 1 tablet by mouth daily.   Multiple Vitamins-Minerals (MULTIVITAMIN WITH MINERALS) tablet Take 1 tablet by mouth daily.   nitroGLYCERIN (NITROSTAT) 0.4 MG SL tablet Place 1 tablet (0.4 mg total) under the tongue every 5 (five) minutes x 3 doses as needed for chest pain.   pioglitazone (ACTOS) 30 MG tablet Take 30 mg by mouth daily.    potassium chloride (KLOR-CON M) 10 MEQ tablet Take one (1) tablet by mouth ( 10 mEq) daily.   telmisartan (MICARDIS) 40 MG tablet Take 0.5 tablets (20 mg total) by mouth daily.     Allergies:   Patient has no known allergies.   Social History   Socioeconomic  History   Marital status: Married    Spouse name: Not on file   Number of children: Not on file   Years of education: Not on file   Highest education level: Not on file  Occupational History   Not on file  Tobacco Use   Smoking status: Never   Smokeless tobacco: Never  Vaping Use   Vaping status: Never Used  Substance and Sexual Activity   Alcohol use: Never   Drug use: Never   Sexual activity: Not on file  Other Topics Concern   Not on file  Social History Narrative   Not on file   Social Determinants of Health   Financial Resource Strain: Not on file  Food Insecurity: Not on file  Transportation Needs: Not on file  Physical Activity: Not on file  Stress: Not on file  Social Connections: Not on file     Family History: The patient's family history includes Stroke (age of onset: 67) in her father. There is no history of Heart disease.  ROS:   Please see the history of present illness.    ROS  All other systems reviewed and negative.   EKGs/Labs/Other Studies Reviewed:    The following studies were reviewed today: none  EKG:  EKG is not ordered today.    Recent Labs: No results found for requested labs within last 365 days.   Recent Lipid Panel    Component Value Date/Time   CHOL 178 06/25/2019 0053   TRIG 103 06/25/2019 0053   HDL 60 06/25/2019 0053   CHOLHDL 3.0 06/25/2019 0053   VLDL 21 06/25/2019 0053   LDLCALC 97 06/25/2019 0053    Physical Exam:    VS:  BP (!) 112/58   Pulse 67   Ht 5' (1.524 m)   Wt 111 lb (50.3 kg)   SpO2 96%   BMI 21.68 kg/m     Wt Readings from Last 3 Encounters:  02/10/23 111 lb (50.3 kg)  12/30/22 106 lb (48.1 kg)  09/03/22 107 lb 12.8 oz (48.9 kg)   GEN: Well nourished, well developed in no acute distress HEENT: Normal NECK: No JVD; No carotid bruits LYMPHATICS: No lymphadenopathy CARDIAC:RRR, no murmurs, rubs, gallops RESPIRATORY:  Clear to auscultation without rales, wheezing or rhonchi  ABDOMEN: Soft,  non-tender, non-distended MUSCULOSKELETAL:  trace LE edema L>R; No deformity  SKIN: Warm and dry NEUROLOGIC:  Alert and oriented x 3 PSYCHIATRIC:  Normal affect   ASSESSMENT:    1. Coronary artery disease involving native coronary artery of native heart without angina pectoris   2. Essential hypertension   3. Hyperlipidemia LDL goal <70   4. DM type 2, goal HbA1c < 7% (HCC)  5. Ischemic cardiomyopathy     PLAN:    In order of problems listed above:  1.  ASCAD -s/p NSTEMI 2021 with cath showing severe single vessel with 95% LAD with spasm and thrombus with distal embolization s/p PCI of LAD with residual 30% mid LAD and ramus.  -denies any CP since last seen -continue prescription drug management with ASA 81mg  daily, atorvastatin 80 mg daily, Lopressor 25 mg twice daily with as needed refills  -Decrease Brilinta to 60 mg twice daily as PCI was in 2021 -I have personally reviewed and interpreted outside labs performed by patient's PCP which showed hemoglobin 12.8 and platelet count 413 on 12/30/2022  2.  HTN -BP adequately controlled on exam today -Continue prescription drug management with Lopressor 25 mg twice daily, telmisartan 20 mg daily with as needed refills -I have personally reviewed and interpreted outside labs performed by patient's PCP which showed serum creatinine 0.82 and potassium 4 on 12/30/2022  3.  HLD -LDL goal < 70 -I have personally reviewed and interpreted outside labs performed by patient's PCP which showed LDL 22 and HDL 50 on 06/19/2022 with ALT 23 -Continue prescription drug management with atorvastatin 80 mg daily with as needed refills  4.  DM2 -followed by PCP -She is on ARB  5.  Ischemic DCM -EF 40-45% post NSTEMI on echo -2D echo 10/02/2022 showed EF 60 to 65% with G2 DD -She appears euvolemic on exam today except for chronic LE edema -I encouraged her to stop using table salt and cooking with salt and limit diet to < 2gm Na daily -I encouraged  her to take her Lasix 20mg  daily instead of sporadically -followup with Eligha Bridegroom NP in 2-3 weeks to make sure this has improved -Continue prescription drug management with telmisartan 20 mg daily, Lopressor 25 mg twice daily, Jardiance 10 mg daily and Lasix 20 mg daily with as needed refills  Followup with Eligha Bridegroom, NP in 2-3 weeks and Me in 1 year  Medication Adjustments/Labs and Tests Ordered: Current medicines are reviewed at length with the patient today.  Concerns regarding medicines are outlined above.  No orders of the defined types were placed in this encounter.  No orders of the defined types were placed in this encounter.   Signed, Armanda Magic, MD  02/10/2023 9:17 AM     [facility]

## 2023-02-10 NOTE — Patient Instructions (Signed)
Medication Instructions:  Please DECREASE your dose of Brilinta from 90 mg to 60 mg twice a day. *If you need a refill on your cardiac medications before your next appointment, please call your pharmacy*   Lab Work: None.  If you have labs (blood work) drawn today and your tests are completely normal, you will receive your results only by: MyChart Message (if you have MyChart) OR A paper copy in the mail If you have any lab test that is abnormal or we need to change your treatment, we will call you to review the results.   Testing/Procedures: None.   Follow-Up: At [facility], you and your health needs are our priority.  As part of our continuing mission to provide you with exceptional heart care, we have created designated Provider Care Teams.  These Care Teams include your primary Cardiologist (physician) and Advanced Practice Providers (APPs -  Physician Assistants and Nurse Practitioners) who all work together to provide you with the care you need, when you need it.  We recommend signing up for the patient portal called "MyChart".  Sign up information is provided on this After Visit Summary.  MyChart is used to connect with patients for Virtual Visits (Telemedicine).  Patients are able to view lab/test results, encounter notes, upcoming appointments, etc.  Non-urgent messages can be sent to your provider as well.   To learn more about what you can do with MyChart, go to [email or web address].    Your next appointment:   3 week(s)  Provider:   Eligha Bridegroom, NP   to have your leg swelling checked. Then, Armanda Magic, MD will plan to see you again in 1 year(s).   Other Instructions Please monitor your salt intake. Limit salt intake to less than 2 grams daily.

## 2023-02-10 NOTE — Addendum Note (Signed)
Addended by: Luellen Pucker on: 02/10/2023 09:33 AM   Modules accepted: Orders

## 2023-02-10 NOTE — Addendum Note (Signed)
Addended by: Luellen Pucker on: 02/10/2023 09:27 AM   Modules accepted: Orders

## 2023-03-05 ENCOUNTER — Ambulatory Visit: Payer: 59 | Attending: Nurse Practitioner | Admitting: Nurse Practitioner

## 2023-03-05 ENCOUNTER — Encounter: Payer: Self-pay | Admitting: Nurse Practitioner

## 2023-03-05 VITALS — BP 110/52 | HR 67 | Ht 60.0 in | Wt 109.0 lb

## 2023-03-05 DIAGNOSIS — I10 Essential (primary) hypertension: Secondary | ICD-10-CM | POA: Diagnosis not present

## 2023-03-05 DIAGNOSIS — E785 Hyperlipidemia, unspecified: Secondary | ICD-10-CM | POA: Diagnosis not present

## 2023-03-05 DIAGNOSIS — I2510 Atherosclerotic heart disease of native coronary artery without angina pectoris: Secondary | ICD-10-CM

## 2023-03-05 DIAGNOSIS — I255 Ischemic cardiomyopathy: Secondary | ICD-10-CM

## 2023-03-05 DIAGNOSIS — M7989 Other specified soft tissue disorders: Secondary | ICD-10-CM

## 2023-03-05 MED ORDER — METOPROLOL TARTRATE 25 MG PO TABS: 25.0000 mg | ORAL_TABLET | Freq: Two times a day (BID) | ORAL | 3 refills | Status: AC

## 2023-03-05 MED ORDER — FUROSEMIDE 20 MG PO TABS: 20.0000 mg | ORAL_TABLET | ORAL | 6 refills | Status: AC | PRN

## 2023-03-05 MED ORDER — ATORVASTATIN CALCIUM 80 MG PO TABS: ORAL_TABLET | ORAL | 3 refills | Status: AC

## 2023-03-05 MED ORDER — TELMISARTAN 40 MG PO TABS: 20.0000 mg | ORAL_TABLET | Freq: Every day | ORAL | 3 refills | Status: AC

## 2023-03-05 MED ORDER — POTASSIUM CHLORIDE CRYS ER 10 MEQ PO TBCR: EXTENDED_RELEASE_TABLET | ORAL | 6 refills | Status: AC

## 2023-03-05 NOTE — Patient Instructions (Signed)
Medication Instructions:   Your physician recommends that you continue on your current medications as directed. Please refer to the Current Medication list given to you today.   *If you need a refill on your cardiac medications before your next appointment, please call your pharmacy*   Lab Work:  TODAY!!!! BMET  If you have labs (blood work) drawn today and your tests are completely normal, you will receive your results only by: MyChart Message (if you have MyChart) OR A paper copy in the mail If you have any lab test that is abnormal or we need to change your treatment, we will call you to review the results.   Testing/Procedures:  None ordered.   Follow-Up: At [facility], you and your health needs are our priority.  As part of our continuing mission to provide you with exceptional heart care, we have created designated Provider Care Teams.  These Care Teams include your primary Cardiologist (physician) and Advanced Practice Providers (APPs -  Physician Assistants and Nurse Practitioners) who all work together to provide you with the care you need, when you need it.  We recommend signing up for the patient portal called "MyChart".  Sign up information is provided on this After Visit Summary.  MyChart is used to connect with patients for Virtual Visits (Telemedicine).  Patients are able to view lab/test results, encounter notes, upcoming appointments, etc.  Non-urgent messages can be sent to your provider as well.   To learn more about what you can do with MyChart, go to [email or web address].    Your next appointment:   1 year(s)  Provider:   Armanda Magic, MD     Other Instructions  Your physician wants you to follow-up in: 1 year.  You will receive a reminder letter in the mail two months in advance. If you don't receive a letter, please call our office to schedule the follow-up appointment.

## 2023-03-05 NOTE — Progress Notes (Signed)
Cardiology Office Note:  .   Date:  03/05/2023  ID:  Holly Scott, DOB 03-27-1956, MRN [id number] PCP: Holly Course, PA-C  [facility] Providers Cardiologist:  Armanda Magic, MD    Patient Profile: .      PMH Hypertension Type 2 DM Coronary artery disease NSTEMI s/p DES to LAD Leg swelling Carotid artery disease Carotid duplex 08/17/21 with minimal stenosis  She presented to med [facility] on 06/24/2019 with chest pain described as "burning" and associated with n/v.  Troponin was elevated and EKG revealed minimal ST elevation in V1 and V2 but did not meet STEMI criteria.  She was transferred to [facility] where she underwent LHC which revealed severe single-vessel 95% stenosis LAD with spasm and thrombus with distal embolization.  She underwent successful PCI of LAD with residual 30% mid LAD and ramus stenosis.  Echo revealed LVEF 40 to 45% post NSTEMI.  Repeat echo 08/2019 revealed normal LVEF at 55 to 60% on medical therapy.  She lives with her family who often accompany her to appointments.  She has chronic leg swelling that she initially thought occurred after starting insulin.  Family was concerned that she was not very active.  She is a vegetarian but family is concerned she does not get enough protein.  Her daughter feels that her diet is high in sodium.  Leg swelling improved with Lasix 20 mg x 3 days then as needed.  TTE 10/01/2022 revealed normal LVEF 60 to 65%, G2 DD, normal RV, no significant valve disease.  Last cardiology clinic visit was 02/10/2023 with Dr. Mayford Knife.  She reported occasional LE edema and admitted to using table salt.  Her Brilinta was decreased to 60 mg twice daily.  She was encouraged to follow a low-sodium diet and to continue Lasix 20 mg daily.  Plan for follow-up in 2 to 3 weeks for leg edema.       History of Present Illness: .   Holly Scott is a pleasant 67 y.o. female who is here today with her son with whom she  lives. She reports leg swelling has improved. Mobility is limited due to knee pain, which has been previously managed with injections. Despite the pain, she remains active at home, caring for a baby and a nine-year-old child. Says she is on her feet all day. She reports adherence to a low-salt diet, consuming mostly fresh foods and [place] cuisine with reduced salt content. She denies chest pain, shortness of breath, early satiety, orthopnea, PND, palpitations, presyncope, syncope. She is planning a two-month trip to [place] in February.   Discussed the use of AI scribe software for clinical note transcription with the patient, who gave verbal consent to proceed.   ROS: See HPI       Studies Reviewed: .        Risk Assessment/Calculations:             Physical Exam:   VS:  BP (!) 110/52   Pulse 67   Ht 5' (1.524 m)   Wt 109 lb (49.4 kg)   SpO2 98%   BMI 21.29 kg/m    Wt Readings from Last 3 Encounters:  03/05/23 109 lb (49.4 kg)  02/10/23 111 lb (50.3 kg)  12/30/22 106 lb (48.1 kg)    GEN: Well nourished, well developed in no acute distress NECK: No JVD; No carotid bruits CARDIAC: RRR, no murmurs, rubs, gallops RESPIRATORY:  Clear to auscultation without rales, wheezing or rhonchi  ABDOMEN: Soft, non-tender, non-distended EXTREMITIES:  No edema; No deformity     ASSESSMENT AND PLAN: .    CAD without angina: S/p NSTEMI 2021 s/p DES to LAD with residual 30% mid LAD and ramus. She denies chest pain, dyspnea, or other symptoms concerning for angina. No indication for further ischemic evaluation at this time. LDL has been well controlled. No bleeding concerns.  We will continue aspirin, Brilinta, metoprolol, telmisartan, Jardiance, atorvastatin.   Ischemic CM/Chronic HFrEF: EF 40-45% post NSTEMI with improvement to normal 08/2019. Most recent TEE 10/01/22 with normal LVEF. She has mild intermittent leg edema that is currently improved. She appears euvolemic on exam.  She denies shortness  of breath, orthopnea, PND, early satiety. We will continue Lasix and potassium as needed for leg swelling. We will check renal function today due to increased diuretic use.  Continue telmisartan, Jardiance, metoprolol.  Leg edema: Chronic intermittent bilateral LE edema that is improved today.  She has chronic knee pain that may be contributing.  She remains active and is on her feet throughout the day caring for young children.  We will continue Lasix and potassium as needed for leg swelling.  We will update renal function today.  Hypertension: BP is well controlled, somewhat soft.  She denies lightheadedness, presyncope, syncope.  She is limiting sodium in her diet but reports BP has always been on the low side of normal.  Advised her to notify us if she develops symptoms of orthostatic hypotension.   Hyperlipidemia LDL goal < 70: LDL 22 on 06/19/22. Continue atorvastatin.        Dispo: 1 year with Dr. Mayford Knife  Signed, Eligha Bridegroom, NP-C

## 2023-03-06 LAB — BASIC METABOLIC PANEL
BUN/Creatinine Ratio: 20 (ref 12–28)
BUN: 15 mg/dL (ref 8–27)
CO2: 25 mmol/L (ref 20–29)
Calcium: 9.7 mg/dL (ref 8.7–10.3)
Chloride: 103 mmol/L (ref 96–106)
Creatinine, Ser: 0.74 mg/dL (ref 0.57–1.00)
Glucose: 111 mg/dL — ABNORMAL HIGH (ref 70–99)
Potassium: 5 mmol/L (ref 3.5–5.2)
Sodium: 140 mmol/L (ref 134–144)
eGFR: 88 mL/min/{1.73_m2} (ref >59)

## 2023-05-05 ENCOUNTER — Telehealth: Payer: Self-pay | Admitting: *Deleted

## 2023-05-05 NOTE — Telephone Encounter (Signed)
-----   Message from Levi Aland sent at 05/05/2023  6:44 AM EST ----- I got a message from a pharmacy and Dr. Mayford Knife that I prescribed Brilinta 90 mg BID for this pt instead of her regular dose of Brilinta 60 mg BID but I don't see it. Will you just call to ensure that she is only taking the 60 mg BID.  Thank you, Marcelino Duster

## 2023-05-05 NOTE — Telephone Encounter (Addendum)
S/w pt's son per (DPR). Stated believes pt is taking brilinta ( 60 mg) bid. Pt's son will only call back if pt is taking one (1) tablet by mouth ( 90 mg) bid.

## 2023-05-05 NOTE — Telephone Encounter (Deleted)
Pt's son will only call if pt is taking Brilinta one (1) tablet by mouth ( 90 mg) bid.

## 2023-09-08 ENCOUNTER — Telehealth: Payer: Self-pay | Admitting: Cardiology

## 2023-09-08 ENCOUNTER — Other Ambulatory Visit: Payer: Self-pay | Admitting: Nurse Practitioner

## 2023-09-08 NOTE — Telephone Encounter (Signed)
*  STAT* If patient is at the pharmacy, call can be transferred to refill team.   1. Which medications need to be refilled? (please list name of each medication and dose if known)   ticagrelor  (BRILINTA ) 60 MG TABS tablet     2. Would you like to learn more about the convenience, safety, & potential cost savings by using the [facility]?   3. Are you open to using the [facility] (Type [facility].  ).   4. Which pharmacy/location (including street and city if local pharmacy) is medication to be sent to?  [id number] - [place], [place] - [address]       5. Do they need a 30 day or 90 day supply? 90 day

## 2024-02-24 ENCOUNTER — Emergency Department (HOSPITAL_BASED_OUTPATIENT_CLINIC_OR_DEPARTMENT_OTHER)

## 2024-02-24 ENCOUNTER — Other Ambulatory Visit: Payer: Self-pay

## 2024-02-24 ENCOUNTER — Encounter (HOSPITAL_BASED_OUTPATIENT_CLINIC_OR_DEPARTMENT_OTHER): Payer: Self-pay | Admitting: Emergency Medicine

## 2024-02-24 ENCOUNTER — Emergency Department (HOSPITAL_BASED_OUTPATIENT_CLINIC_OR_DEPARTMENT_OTHER)
Admission: EM | Admit: 2024-02-24 | Discharge: 2024-02-25 | Disposition: A | Discharge status: Home / Self Care | Attending: Emergency Medicine | Admitting: Emergency Medicine

## 2024-02-24 DIAGNOSIS — S82191A Other fracture of upper end of right tibia, initial encounter for closed fracture: Secondary | ICD-10-CM | POA: Insufficient documentation

## 2024-02-24 DIAGNOSIS — Z7902 Long term (current) use of antithrombotics/antiplatelets: Secondary | ICD-10-CM | POA: Insufficient documentation

## 2024-02-24 DIAGNOSIS — S8991XA Unspecified injury of right lower leg, initial encounter: Secondary | ICD-10-CM | POA: Diagnosis present

## 2024-02-24 DIAGNOSIS — Z7984 Long term (current) use of oral hypoglycemic drugs: Secondary | ICD-10-CM | POA: Diagnosis not present

## 2024-02-24 DIAGNOSIS — W1839XA Other fall on same level, initial encounter: Secondary | ICD-10-CM | POA: Insufficient documentation

## 2024-02-24 DIAGNOSIS — M79661 Pain in right lower leg: Secondary | ICD-10-CM | POA: Diagnosis present

## 2024-02-24 DIAGNOSIS — Z7982 Long term (current) use of aspirin: Secondary | ICD-10-CM | POA: Diagnosis not present

## 2024-02-24 DIAGNOSIS — S82101A Unspecified fracture of upper end of right tibia, initial encounter for closed fracture: Secondary | ICD-10-CM | POA: Insufficient documentation

## 2024-02-24 MED ORDER — ONDANSETRON HCL 4 MG/2ML IJ SOLN
4.0000 mg | Freq: Once | INTRAMUSCULAR | Status: AC
  Administered 2024-02-24: 4 mg via INTRAVENOUS
  Filled 2024-02-24: qty 2

## 2024-02-24 MED ORDER — MORPHINE SULFATE (PF) 4 MG/ML IV SOLN
4.0000 mg | Freq: Once | INTRAVENOUS | Status: AC
  Administered 2024-02-24: 4 mg via INTRAVENOUS
  Filled 2024-02-24: qty 1

## 2024-02-24 NOTE — ED Provider Notes (Signed)
 [facility] [place] Provider Note   CSN: [id number] Arrival date & time: 02/24/24  2243     Patient presents with: Fall and Leg Injury   Holly Scott is a 68 y.o. female.   Patient presents for evaluation of right leg injury.  Patient fell this evening.  She reports that she fell onto her knees and is complaining of pain and swelling below the right knee.  No other injury.       Prior to Admission medications   Medication Sig Start Date End Date Taking? Authorizing Provider  HYDROcodone-acetaminophen  (NORCO/VICODIN) 5-325 MG tablet Take 1 tablet by mouth every 6 (six) hours as needed for moderate pain (pain score 4-6). 02/25/24  Yes Mir Fullilove, Lonni PARAS, MD  aspirin  EC 81 MG tablet Take 81 mg by mouth daily.    [provider]  atorvastatin  (LIPITOR ) 80 MG tablet TAKE 1 TABLET(80 MG) BY MOUTH DAILY AT 6 PM 03/05/23   Swinyer, Rosaline HERO, NP  BD PEN NEEDLE NANO 2ND GEN 32G X 4 MM MISC Use 1 time daily 06/17/22   [provider]  BRILINTA  90 MG TABS tablet TAKE 1 TABLET(90 MG) BY MOUTH TWICE DAILY 09/08/23   Shlomo Wilbert SAUNDERS, MD  furosemide  (LASIX ) 20 MG tablet Take 1 tablet (20 mg total) by mouth as needed for fluid or edema. 03/05/23   Swinyer, Rosaline HERO, NP  insulin  degludec (TRESIBA) 100 UNIT/ML FlexTouch Pen Inject 10 Units into the skin daily. 06/17/22   [provider]  JARDIANCE  10 MG TABS tablet TAKE 1 TABLET BY MOUTH AS DIRECTED 09/08/23   Swinyer, Rosaline HERO, NP  metFORMIN (GLUCOPHAGE) 500 MG tablet Take 500 mg by mouth 2 (two) times daily with a meal.    [provider]  metoprolol  tartrate (LOPRESSOR ) 25 MG tablet Take 1 tablet (25 mg total) by mouth 2 (two) times daily. 03/05/23 03/04/24  Swinyer, Rosaline HERO, NP  Multiple Vitamin SUEANNE) TABS Take 1 tablet by mouth daily. 07/10/17   [provider]  Multiple Vitamins-Minerals (MULTIVITAMIN WITH MINERALS) tablet Take 1 tablet by mouth  daily.    [provider]  nitroGLYCERIN  (NITROSTAT ) 0.4 MG SL tablet Place 1 tablet (0.4 mg total) under the tongue every 5 (five) minutes x 3 doses as needed for chest pain. 08/07/21   Swinyer, Rosaline HERO, NP  pioglitazone (ACTOS) 45 MG tablet Take 45 mg by mouth daily.    [provider]  potassium chloride  (KLOR-CON  M) 10 MEQ tablet Take one (1) tablet by mouth ( ) daily as needed with Lasix . 03/05/23   Swinyer, Rosaline HERO, NP  telmisartan  (MICARDIS ) 40 MG tablet Take 0.5 tablets (20 mg total) by mouth daily. 03/05/23 03/05/24  Swinyer, Rosaline HERO, NP  ticagrelor  (BRILINTA ) 60 MG TABS tablet Take 1 tablet (60 mg total) by mouth 2 (two) times daily. 02/10/23   Shlomo Wilbert SAUNDERS, MD    Allergies: Patient has no known allergies.    Review of Systems  Updated Vital Signs BP (!) 121/59   Pulse 77   Temp 98.4 F (36.9 C) (Oral)   Resp 17   Ht 5' (1.524 m)   Wt 43.5 kg   SpO2 98%   BMI 18.75 kg/m   Physical Exam Vitals and nursing note reviewed.  Constitutional:      General: She is not in acute distress.    Appearance: She is well-developed.  HENT:     Head: Normocephalic and atraumatic.  Mouth/Throat:     Mouth: Mucous membranes are moist.  Eyes:     General: Vision grossly intact. Gaze aligned appropriately.     Extraocular Movements: Extraocular movements intact.     Conjunctiva/sclera: Conjunctivae normal.  Cardiovascular:     Rate and Rhythm: Normal rate and regular rhythm.     Pulses: Normal pulses.     Heart sounds: Normal heart sounds, S1 normal and S2 normal. No murmur heard.    No friction rub. No gallop.  Pulmonary:     Effort: Pulmonary effort is normal. No respiratory distress.     Breath sounds: Normal breath sounds.  Abdominal:     General: Bowel sounds are normal.     Palpations: Abdomen is soft.     Tenderness: There is no abdominal tenderness. There is no guarding or rebound.     Hernia: No hernia is present.  Musculoskeletal:         General: No swelling.     Cervical back: Full passive range of motion without pain, normal range of motion and neck supple. No spinous process tenderness or muscular tenderness. Normal range of motion.     Right knee: Swelling present. Tenderness present.     Right lower leg: No edema.     Left lower leg: No edema.       Legs:  Skin:    General: Skin is warm and dry.     Capillary Refill: Capillary refill takes less than 2 seconds.     Findings: No ecchymosis, erythema, rash or wound.  Neurological:     General: No focal deficit present.     Mental Status: She is alert and oriented to person, place, and time.     GCS: GCS eye subscore is 4. GCS verbal subscore is 5. GCS motor subscore is 6.     Cranial Nerves: Cranial nerves 2-12 are intact.     Sensory: Sensation is intact.     Motor: Motor function is intact.     Coordination: Coordination is intact.  Psychiatric:        Attention and Perception: Attention normal.        Mood and Affect: Mood normal.        Speech: Speech normal.        Behavior: Behavior normal.     (all labs ordered are listed, but only abnormal results are displayed) Labs Reviewed - No data to display  EKG: None  Radiology: DG Tibia/Fibula Right Port Result Date: 02/24/2024 EXAM: _VIEWS_ VIEW(S) XRAY OF THE RIGHT TIBIA AND FIBULA 02/24/2024 11:06:00 PM COMPARISON: None available. CLINICAL HISTORY: Fall. FINDINGS: BONES AND JOINTS: Mild osteopenia. There is an acute closed transverse oblique fracture through the proximal tibial metaphysis with very slight impaction at the fracture site, small comminution fragments at the fracture margins, and not significantly angulated or otherwise displaced. No fracture line is seen extending through the tibial plateaus. There is a nondisplaced acute transverse fibular neck fracture as well. No focal osseous lesion. No joint dislocation. SOFT TISSUES: The soft tissues are unremarkable. IMPRESSION: 1. Acute closed  transverse oblique fracture through the proximal tibial metaphysis with very slight impaction and small comminution fragments, without significant angulation or other displacement. 2. No extension into the tibial plateaus. 3. Nondisplaced acute transverse fibular neck fracture. Electronically signed by: Francis Quam MD 02/24/2024 11:12 PM EST RP Workstation: [id number]     Procedures   Medications Ordered in the ED  morphine  (PF) 4 MG/ML injection 4 mg (4 mg  Intravenous Given 02/24/24 2346)  ondansetron  (ZOFRAN ) injection 4 mg (4 mg Intravenous Given 02/24/24 2345)                                    Medical Decision Making Amount and/or Complexity of Data Reviewed Radiology: ordered and independent interpretation performed. Decision-making details documented in ED Course.  Risk Prescription drug management.    Presents to the emergency department with knee injury.  Patient fell earlier tonight.  She has isolated knee injury, no other injuries noted.  No head injury, does not take blood thinners.  X-ray shows transverse or oblique fracture through the proximal metaphysis of the tibia.  This was discussed with Dr. Genelle, on-call for orthopedics.  He has reviewed the images and feels that the patient can be discharged with a knee immobilizer and follow-up in the office.  Provided analgesia.  Given return precautions.  Compartments, lower leg are soft.  Neurovascularly intact.     Final diagnoses:  Closed fracture of proximal end of right tibia, unspecified fracture morphology, initial encounter    ED Discharge Orders          Ordered    HYDROcodone-acetaminophen  (NORCO/VICODIN) 5-325 MG tablet  Every 6 hours PRN        02/25/24 0013               Haze Lonni PARAS, MD 02/25/24 [phone]

## 2024-02-24 NOTE — ED Triage Notes (Signed)
 Per EMS called out for fall happened about 2200. No blood thinners, no head injury. Pain to right lower leg. Swelling noted.

## 2024-02-25 MED ORDER — HYDROCODONE-ACETAMINOPHEN 5-325 MG PO TABS: 1.0000 | ORAL_TABLET | Freq: Four times a day (QID) | ORAL | 0 refills | Status: AC | PRN

## 2024-02-25 MED ORDER — WALKER MISC: 0 refills | Status: AC

## 2024-02-25 MED ORDER — FOLDING WALKER MISC: 0 refills | Status: AC

## 2024-02-26 ENCOUNTER — Ambulatory Visit (INDEPENDENT_AMBULATORY_CARE_PROVIDER_SITE_OTHER): Admitting: Family

## 2024-02-26 DIAGNOSIS — S82101A Unspecified fracture of upper end of right tibia, initial encounter for closed fracture: Secondary | ICD-10-CM

## 2024-02-26 DIAGNOSIS — S82831A Other fracture of upper and lower end of right fibula, initial encounter for closed fracture: Secondary | ICD-10-CM

## 2024-02-26 NOTE — Progress Notes (Signed)
 Office Visit Note   Patient: Holly Scott           Date of Birth: 06/23/1955           MRN: [id number] Visit Date: 02/26/2024              Requested by: Latisha Ronal Crank, PA-C [address] [place],  [place] [address] PCP: Latisha Ronal Crank, PA-C  Chief Complaint  Patient presents with   Right Leg - Fracture      HPI: The patient is a 68 year old woman who presents in follow-up from emergency department she had a fall on November 12 with direct impact on both knees had immediate onset of pain and swelling to the right knee  In the ED radiographs revealed transverse oblique fracture through the proximal metaphysis of the tibia.  They discussed case with Dr. Genelle who recommended knee immobilizer and outpatient follow-up with himself  Did call the [facility] and was unable to get an appointment with him  Is currently wearing the knee immobilizer from the knee to the ankle she has poorly controlled pain  Assessment & Plan: Visit Diagnoses:  1. Closed fracture of proximal end of right tibia and fibula, initial encounter     Plan: Discussed case with Dr. Addie as well as Dr. Brant Gory she will follow-up in the office with Dr. Danetta team ASAP he also requested CT of the tib-fib on the right she was placed in a hinged knee brace per Dr. Ollis request  Follow-Up Instructions: No follow-ups on file.   Ortho Exam  Patient is alert, oriented, no adenopathy, well-dressed, normal affect, normal respiratory effort. On examination right lower extremity she has significant pain with extension of the knee compartments are soft she is distally neurovascularly intact    Imaging: No results found. No images are attached to the encounter.  Labs: Lab Results  Component Value Date   HGBA1C 9.3 (H) 06/25/2019     No results found for: ALBUMIN, PREALBUMIN, CBC  Lab Results  Component Value Date   MG 1.7 06/27/2019   No results  found for: VD25OH  No results found for: PREALBUMIN    Latest Ref Rng & Units 10/26/2020    8:10 PM 06/28/2019    3:01 AM 06/27/2019    4:05 AM  CBC EXTENDED  WBC 4.0 - 10.5 K/uL 14.7  8.8  7.5   RBC 3.87 - 5.11 MIL/uL 4.13  4.34  4.37   Hemoglobin 12.0 - 15.0 g/dL 87.9  87.7  87.7   HCT 36.0 - 46.0 % 36.4  37.8  38.6   Platelets 150 - 400 K/uL 203  262  262   NEUT# 1.7 - 7.7 K/uL 12.9     Lymph# 0.7 - 4.0 K/uL 0.4        There is no height or weight on file to calculate BMI.  Orders:  Orders Placed This Encounter  Procedures   CT TIBIA FIBULA RIGHT WO CONTRAST   No orders of the defined types were placed in this encounter.    Procedures: No procedures performed  Clinical Data: No additional findings.  ROS:  All other systems negative, except as noted in the HPI. Review of Systems  Objective: Vital Signs: There were no vitals taken for this visit.  Specialty Comments:  No specialty comments available.  PMFS History: Patient Active Problem List   Diagnosis Date Noted   HLD (hyperlipidemia)    CAD (coronary artery disease), native  coronary artery    NSTEMI (non-ST elevated myocardial infarction) (HCC) 06/24/2019   Essential hypertension    DM type 2, goal HbA1c < 7% (HCC)    Past Medical History:  Diagnosis Date   CAD (coronary artery disease), native coronary artery 2021   s/p NSTEMI; cath 2021 with 95% LAD with spasm and thrombus with distal embolization s/p PCI of LAD with residual 30% mid LAD and ramus   Diabetes mellitus without complication (HCC)    HLD (hyperlipidemia)    Hypertension     Family History  Problem Relation Age of Onset   Stroke Father 64   Heart disease Neg Hx     Past Surgical History:  Procedure Laterality Date   CORONARY STENT INTERVENTION N/A 06/27/2019   Procedure: CORONARY STENT INTERVENTION;  Surgeon: Anner Alm ORN, MD;  Location: [facility] INVASIVE CV LAB;  Service: Cardiovascular;  Laterality: N/A;   LEFT HEART CATH AND  CORONARY ANGIOGRAPHY N/A 06/27/2019   Procedure: LEFT HEART CATH AND CORONARY ANGIOGRAPHY;  Surgeon: Anner Alm ORN, MD;  Location: [facility] INVASIVE CV LAB;  Service: Cardiovascular;  Laterality: N/A;   Social History   Occupational History   Not on file  Tobacco Use   Smoking status: Never   Smokeless tobacco: Never  Vaping Use   Vaping status: Never Used  Substance and Sexual Activity   Alcohol use: Never   Drug use: Never   Sexual activity: Not on file

## 2024-02-29 ENCOUNTER — Ambulatory Visit
Admission: RE | Admit: 2024-02-29 | Discharge: 2024-02-29 | Disposition: A | Discharge status: Home / Self Care | Source: Ambulatory Visit | Attending: Family | Admitting: Family

## 2024-02-29 ENCOUNTER — Ambulatory Visit: Admitting: Physician Assistant

## 2024-02-29 ENCOUNTER — Telehealth (HOSPITAL_BASED_OUTPATIENT_CLINIC_OR_DEPARTMENT_OTHER): Payer: Self-pay | Admitting: Orthopaedic Surgery

## 2024-02-29 ENCOUNTER — Ambulatory Visit (HOSPITAL_BASED_OUTPATIENT_CLINIC_OR_DEPARTMENT_OTHER): Admitting: Student

## 2024-02-29 DIAGNOSIS — S82101A Unspecified fracture of upper end of right tibia, initial encounter for closed fracture: Secondary | ICD-10-CM | POA: Insufficient documentation

## 2024-02-29 NOTE — Progress Notes (Signed)
 Office Visit Note   Patient: Holly Scott           Date of Birth: 08-14-1955           MRN: [id number] Visit Date: 02/29/2024              Requested by: Latisha Ronal Crank, PA-C [address] [place],  [place] [address] PCP: Latisha Ronal Crank, PA-C  Chief Complaint  Patient presents with   Right Leg - Pain      HPI: Patient is a 68 year old woman who is accompanied by her sons.  She comes in today with referral from the emergency room for right proximal tibia fracture.  This happened 4 days ago when she slipped at home.  She is normally quite active and enjoys cooking and taking care of her house.  She does have a history of arthritis in this knee.  She has a history significant for cardiac disease and diabetes.  She is taking an aspirin  a day for her cardiac issues  Assessment & Plan: Visit Diagnoses:  1. Closed fracture of proximal end of right tibia, unspecified fracture morphology, initial encounter     Plan: I spoke with the patient and her son.  They did see Erin on Friday and a CT scan was ordered.  We now have an appointment for this on Thursday afternoon.  We will make sure she gets to see Dr. Bubba on Friday.  In the meantime she should stay on her aspirin  a day.  Her leg is immobilized in a knee brace that is down to her ankle.  We will adjust this today as well.  Should remain nonweightbearing.  Explained to her sons to that I do have concerns this could be a fragility fracture and wants her acute fractures addressed would recommend a referral to the osteoporosis clinic.  Compartments are soft and nontender she has some bruising and tenderness proximally.  Distal pulses are intact no evidence of cellulitis or DVT.  Follow-Up Instructions: With dr. Bubba Beers Exam  Patient is alert, oriented, no adenopathy, well-dressed, normal affect, normal respiratory effort. Examination of her right leg    Imaging: No results found. No images  are attached to the encounter.  Labs: Lab Results  Component Value Date   HGBA1C 9.3 (H) 06/25/2019     No results found for: ALBUMIN, PREALBUMIN, CBC  Lab Results  Component Value Date   MG 1.7 06/27/2019   No results found for: VD25OH  No results found for: PREALBUMIN    Latest Ref Rng & Units 10/26/2020    8:10 PM 06/28/2019    3:01 AM 06/27/2019    4:05 AM  CBC EXTENDED  WBC 4.0 - 10.5 K/uL 14.7  8.8  7.5   RBC 3.87 - 5.11 MIL/uL 4.13  4.34  4.37   Hemoglobin 12.0 - 15.0 g/dL 87.9  87.7  87.7   HCT 36.0 - 46.0 % 36.4  37.8  38.6   Platelets 150 - 400 K/uL 203  262  262   NEUT# 1.7 - 7.7 K/uL 12.9     Lymph# 0.7 - 4.0 K/uL 0.4        There is no height or weight on file to calculate BMI.  Orders:  No orders of the defined types were placed in this encounter.  No orders of the defined types were placed in this encounter.    Procedures: No procedures performed  Clinical Data: No additional findings.  ROS:  All other systems negative, except as noted in the HPI. Review of Systems  Objective: Vital Signs: There were no vitals taken for this visit.  Specialty Comments:  No specialty comments available.  PMFS History: Patient Active Problem List   Diagnosis Date Noted   Closed fracture of right proximal tibia 02/29/2024   HLD (hyperlipidemia)    CAD (coronary artery disease), native coronary artery    NSTEMI (non-ST elevated myocardial infarction) (HCC) 06/24/2019   Essential hypertension    DM type 2, goal HbA1c < 7% (HCC)    Past Medical History:  Diagnosis Date   CAD (coronary artery disease), native coronary artery 2021   s/p NSTEMI; cath 2021 with 95% LAD with spasm and thrombus with distal embolization s/p PCI of LAD with residual 30% mid LAD and ramus   Diabetes mellitus without complication (HCC)    HLD (hyperlipidemia)    Hypertension     Family History  Problem Relation Age of Onset   Stroke Father 65   Heart disease Neg Hx      Past Surgical History:  Procedure Laterality Date   CORONARY STENT INTERVENTION N/A 06/27/2019   Procedure: CORONARY STENT INTERVENTION;  Surgeon: Anner Alm ORN, MD;  Location: [facility] INVASIVE CV LAB;  Service: Cardiovascular;  Laterality: N/A;   LEFT HEART CATH AND CORONARY ANGIOGRAPHY N/A 06/27/2019   Procedure: LEFT HEART CATH AND CORONARY ANGIOGRAPHY;  Surgeon: Anner Alm ORN, MD;  Location: [facility] INVASIVE CV LAB;  Service: Cardiovascular;  Laterality: N/A;   Social History   Occupational History   Not on file  Tobacco Use   Smoking status: Never   Smokeless tobacco: Never  Vaping Use   Vaping status: Never Used  Substance and Sexual Activity   Alcohol use: Never   Drug use: Never   Sexual activity: Not on file

## 2024-02-29 NOTE — Telephone Encounter (Signed)
 Call patient to get her sch this week with Dr B left a voice mail

## 2024-03-01 NOTE — Progress Notes (Signed)
 Per chart review patient had a follow up with Ortho on 02/29/24. Changes made were : referral to Osteoporosis clinic. Per [facility] protocol, outreach is not warranted at this time as the patient has already had a follow up appt within 7 days from this encounter.   Suzen Sharps, RN, BSN [employer]  [phone]

## 2024-03-03 ENCOUNTER — Other Ambulatory Visit (HOSPITAL_BASED_OUTPATIENT_CLINIC_OR_DEPARTMENT_OTHER)

## 2024-03-03 ENCOUNTER — Ambulatory Visit (INDEPENDENT_AMBULATORY_CARE_PROVIDER_SITE_OTHER): Admitting: Orthopaedic Surgery

## 2024-03-03 DIAGNOSIS — S82101A Unspecified fracture of upper end of right tibia, initial encounter for closed fracture: Secondary | ICD-10-CM | POA: Diagnosis not present

## 2024-03-03 NOTE — Progress Notes (Signed)
 Chief Complaint: Right tibia fracture     History of Present Illness:    Holly Scott is a 68 y.o. female presents 1-1/2 weeks status post right proximal tibia fracture after a fall from standing.  She is here today for CT discussion as well as further discussion.  She has been compliant with nonweightbearing in her brace    PMH/PSH/Family History/Social History/Meds/Allergies:    Past Medical History:  Diagnosis Date   CAD (coronary artery disease), native coronary artery 2021   s/p NSTEMI; cath 2021 with 95% LAD with spasm and thrombus with distal embolization s/p PCI of LAD with residual 30% mid LAD and ramus   Diabetes mellitus without complication (HCC)    HLD (hyperlipidemia)    Hypertension    Past Surgical History:  Procedure Laterality Date   CORONARY STENT INTERVENTION N/A 06/27/2019   Procedure: CORONARY STENT INTERVENTION;  Surgeon: Anner Alm ORN, MD;  Location: [facility] INVASIVE CV LAB;  Service: Cardiovascular;  Laterality: N/A;   LEFT HEART CATH AND CORONARY ANGIOGRAPHY N/A 06/27/2019   Procedure: LEFT HEART CATH AND CORONARY ANGIOGRAPHY;  Surgeon: Anner Alm ORN, MD;  Location: [facility] INVASIVE CV LAB;  Service: Cardiovascular;  Laterality: N/A;   Social History   Socioeconomic History   Marital status: Married    Spouse name: Not on file   Number of children: Not on file   Years of education: Not on file   Highest education level: Not on file  Occupational History   Not on file  Tobacco Use   Smoking status: Never   Smokeless tobacco: Never  Vaping Use   Vaping status: Never Used  Substance and Sexual Activity   Alcohol use: Never   Drug use: Never   Sexual activity: Not on file  Other Topics Concern   Not on file  Social History Narrative   Not on file   Social Drivers of Health   Financial Resource Strain: Not on file  Food Insecurity: Low Risk  (05/20/2023)   Received from [facility]   Hunger Vital Sign    Within the past 12 months,  you worried that your food would run out before you got money to buy more: Never true    Within the past 12 months, the food you bought just didn't last and you didn't have money to get more. : Never true  Transportation Needs: Unmet Transportation Needs (05/20/2023)   Received from [facility]    In the past 12 months, has lack of reliable transportation kept you from medical appointments, meetings, work or from getting things needed for daily living? : Yes  Physical Activity: Not on file  Stress: Not on file  Social Connections: Not on file   Family History  Problem Relation Age of Onset   Stroke Father 83   Heart disease Neg Hx    No Known Allergies Current Outpatient Medications  Medication Sig Dispense Refill   aspirin  EC 81 MG tablet Take 81 mg by mouth daily.     atorvastatin  (LIPITOR ) 80 MG tablet TAKE 1 TABLET(80 MG) BY MOUTH DAILY AT 6 PM 90 tablet 3   BD PEN NEEDLE NANO 2ND GEN 32G X 4 MM MISC Use 1 time daily     BRILINTA  90 MG TABS tablet TAKE 1 TABLET(90 MG) BY MOUTH TWICE DAILY 180 tablet 1   furosemide  (LASIX ) 20 MG tablet Take 1 tablet (20 mg total) by mouth as needed for fluid or edema. 30 tablet  6   HYDROcodone -acetaminophen  (NORCO/VICODIN) 5-325 MG tablet Take 1 tablet by mouth every 6 (six) hours as needed for moderate pain (pain score 4-6). 20 tablet 0   insulin  degludec (TRESIBA) 100 UNIT/ML FlexTouch Pen Inject 10 Units into the skin daily.     JARDIANCE  10 MG TABS tablet TAKE 1 TABLET BY MOUTH AS DIRECTED 90 tablet 1   metFORMIN (GLUCOPHAGE) 500 MG tablet Take 500 mg by mouth 2 (two) times daily with a meal.     metoprolol  tartrate (LOPRESSOR ) 25 MG tablet Take 1 tablet (25 mg total) by mouth 2 (two) times daily. 180 tablet 3   Misc. Devices (FOLDING WALKER) MISC Use to assist with walking 1 each 0   Misc. Devices (WALKER) MISC Dispense 1 standard walker to assist with ambulation 1 each 0   Multiple Vitamin (QUINTABS) TABS Take 1 tablet by mouth  daily.     Multiple Vitamins-Minerals (MULTIVITAMIN WITH MINERALS) tablet Take 1 tablet by mouth daily.     nitroGLYCERIN  (NITROSTAT ) 0.4 MG SL tablet Place 1 tablet (0.4 mg total) under the tongue every 5 (five) minutes x 3 doses as needed for chest pain. 25 tablet 3   pioglitazone (ACTOS) 45 MG tablet Take 45 mg by mouth daily.     potassium chloride  (KLOR-CON  M) 10 MEQ tablet Take one (1) tablet by mouth ( ) daily as needed with Lasix . 30 tablet 6   telmisartan  (MICARDIS ) 40 MG tablet Take 0.5 tablets (20 mg total) by mouth daily. 45 tablet 3   ticagrelor  (BRILINTA ) 60 MG TABS tablet Take 1 tablet (60 mg total) by mouth 2 (two) times daily. 180 tablet 3   No current facility-administered medications for this visit.   No results found.  Review of Systems:   A ROS was performed including pertinent positives and negatives as documented in the HPI.  Physical Exam :   Constitutional: NAD and appears stated age Neurological: Alert and oriented Psych: Appropriate affect and cooperative There were no vitals taken for this visit.   Comprehensive Musculoskeletal Exam:    Pain about the right proximal tibia.  Braces in place.  She sits in a wheelchair.   Imaging:   Xray (right tibia-fibula 2 views, CT scan right tibia): Nondisplaced proximal tibia fracture oblique    I personally reviewed and interpreted the radiographs.   Assessment and Plan:   68 y.o. female proximal tibia fracture following a fall from standing.  I discussed treatment options at this point.  We did discuss that surgical fixation could result in earlier weightbearing although at this time she has not nondisplaced fracture and I would be concerned about putting her through surgery.  I did discuss the alternatives and ultimately she may require a total knee arthroplasty due to right knee arthritis given this I do think that it would probably be ideal to consider conservative management which her sons agree with.  At  this time we will plan for home health therapy and she will remain nonweightbearing on plan to see her back in 3 weeks for repeat x-rays  -Return to clinic 3 weeks   I personally saw and evaluated the patient, and participated in the management and treatment plan.  Elspeth Parker, MD Attending Physician, Orthopedic Surgery  This document was dictated using Dragon voice recognition software. A reasonable attempt at proof reading has been made to minimize errors.

## 2024-03-07 ENCOUNTER — Telehealth: Payer: Self-pay | Admitting: Orthopaedic Surgery

## 2024-03-07 NOTE — Telephone Encounter (Signed)
 Called and gave verbal ok

## 2024-03-07 NOTE — Telephone Encounter (Signed)
 Albert from [facility] called. Need verbal orders for PT. 1wk 3x, 2wk 2x, 1wk 4x. Cb# [phone]

## 2024-03-22 ENCOUNTER — Telehealth: Payer: Self-pay | Admitting: Orthopaedic Surgery

## 2024-03-22 NOTE — Telephone Encounter (Signed)
 Holding. Pt is being seen tomorrow and is currently non weight bearing

## 2024-03-22 NOTE — Telephone Encounter (Signed)
 Darice from [facility] called. Did a 1x OT visit with patient. Patient and family wants her to have PT. Karen's ph # [phone]

## 2024-03-23 ENCOUNTER — Other Ambulatory Visit (HOSPITAL_BASED_OUTPATIENT_CLINIC_OR_DEPARTMENT_OTHER): Payer: Self-pay

## 2024-03-23 ENCOUNTER — Ambulatory Visit (INDEPENDENT_AMBULATORY_CARE_PROVIDER_SITE_OTHER): Admitting: Orthopaedic Surgery

## 2024-03-23 ENCOUNTER — Ambulatory Visit (INDEPENDENT_AMBULATORY_CARE_PROVIDER_SITE_OTHER)

## 2024-03-23 DIAGNOSIS — S82101A Unspecified fracture of upper end of right tibia, initial encounter for closed fracture: Secondary | ICD-10-CM | POA: Diagnosis not present

## 2024-03-23 MED ORDER — TRAMADOL HCL 50 MG PO TABS
50.0000 mg | ORAL_TABLET | Freq: Four times a day (QID) | ORAL | 0 refills | Status: AC | PRN
  Filled 2024-03-23: qty 20, 5d supply, fill #0

## 2024-03-23 NOTE — Progress Notes (Signed)
 Chief Complaint: Right tibia fracture     History of Present Illness:   03/23/2024: Presents today for follow-up of her right tibia fracture.  She is here today for MRI discussion.  She has been compliant with nonweightbearing  Holly Scott is a 68 y.o. female presents 1-1/2 weeks status post right proximal tibia fracture after a fall from standing.  She is here today for CT discussion as well as further discussion.  She has been compliant with nonweightbearing in her brace    PMH/PSH/Family History/Social History/Meds/Allergies:    Past Medical History:  Diagnosis Date   CAD (coronary artery disease), native coronary artery 2021   s/p NSTEMI; cath 2021 with 95% LAD with spasm and thrombus with distal embolization s/p PCI of LAD with residual 30% mid LAD and ramus   Diabetes mellitus without complication (HCC)    HLD (hyperlipidemia)    Hypertension    Past Surgical History:  Procedure Laterality Date   CORONARY STENT INTERVENTION N/A 06/27/2019   Procedure: CORONARY STENT INTERVENTION;  Surgeon: Anner Alm ORN, MD;  Location: [facility] INVASIVE CV LAB;  Service: Cardiovascular;  Laterality: N/A;   LEFT HEART CATH AND CORONARY ANGIOGRAPHY N/A 06/27/2019   Procedure: LEFT HEART CATH AND CORONARY ANGIOGRAPHY;  Surgeon: Anner Alm ORN, MD;  Location: [facility] INVASIVE CV LAB;  Service: Cardiovascular;  Laterality: N/A;   Social History   Socioeconomic History   Marital status: Married    Spouse name: Not on file   Number of children: Not on file   Years of education: Not on file   Highest education level: Not on file  Occupational History   Not on file  Tobacco Use   Smoking status: Never   Smokeless tobacco: Never  Vaping Use   Vaping status: Never Used  Substance and Sexual Activity   Alcohol use: Never   Drug use: Never   Sexual activity: Not on file  Other Topics Concern   Not on file  Social History Narrative   Not on file   Social Drivers of Health    Financial Resource Strain: Not on file  Food Insecurity: Low Risk (05/20/2023)   Received from [facility]   Hunger Vital Sign    Within the past 12 months, you worried that your food would run out before you got money to buy more: Never true    Within the past 12 months, the food you bought just didn't last and you didn't have money to get more. : Never true  Transportation Needs: Unmet Transportation Needs (05/20/2023)   Received from [employer]    In the past 12 months, has lack of reliable transportation kept you from medical appointments, meetings, work or from getting things needed for daily living? : Yes  Physical Activity: Not on file  Stress: Not on file  Social Connections: Not on file   Family History  Problem Relation Age of Onset   Stroke Father 69   Heart disease Neg Hx    No Known Allergies Current Outpatient Medications  Medication Sig Dispense Refill   traMADol  (ULTRAM ) 50 MG tablet Take 1 tablet (50 mg total) by mouth every 6 (six) hours as needed. 20 tablet 0   aspirin  EC 81 MG tablet Take 81 mg by mouth daily.     atorvastatin  (LIPITOR ) 80 MG tablet TAKE 1 TABLET(80 MG) BY MOUTH DAILY AT 6 PM 90 tablet 3   BD PEN NEEDLE NANO 2ND GEN 32G X 4 MM  MISC Use 1 time daily     BRILINTA  90 MG TABS tablet TAKE 1 TABLET(90 MG) BY MOUTH TWICE DAILY 180 tablet 1   furosemide  (LASIX ) 20 MG tablet Take 1 tablet (20 mg total) by mouth as needed for fluid or edema. 30 tablet 6   HYDROcodone -acetaminophen  (NORCO/VICODIN) 5-325 MG tablet Take 1 tablet by mouth every 6 (six) hours as needed for moderate pain (pain score 4-6). 20 tablet 0   insulin  degludec (TRESIBA) 100 UNIT/ML FlexTouch Pen Inject 10 Units into the skin daily.     JARDIANCE  10 MG TABS tablet TAKE 1 TABLET BY MOUTH AS DIRECTED 90 tablet 1   metFORMIN (GLUCOPHAGE) 500 MG tablet Take 500 mg by mouth 2 (two) times daily with a meal.     metoprolol  tartrate (LOPRESSOR ) 25 MG tablet Take 1 tablet (25  mg total) by mouth 2 (two) times daily. 180 tablet 3   Misc. Devices (FOLDING WALKER) MISC Use to assist with walking 1 each 0   Misc. Devices (WALKER) MISC Dispense 1 standard walker to assist with ambulation 1 each 0   Multiple Vitamin (QUINTABS) TABS Take 1 tablet by mouth daily.     Multiple Vitamins-Minerals (MULTIVITAMIN WITH MINERALS) tablet Take 1 tablet by mouth daily.     nitroGLYCERIN  (NITROSTAT ) 0.4 MG SL tablet Place 1 tablet (0.4 mg total) under the tongue every 5 (five) minutes x 3 doses as needed for chest pain. 25 tablet 3   pioglitazone (ACTOS) 45 MG tablet Take 45 mg by mouth daily.     potassium chloride  (KLOR-CON  M) 10 MEQ tablet Take one (1) tablet by mouth ( ) daily as needed with Lasix . 30 tablet 6   telmisartan  (MICARDIS ) 40 MG tablet Take 0.5 tablets (20 mg total) by mouth daily. 45 tablet 3   ticagrelor  (BRILINTA ) 60 MG TABS tablet Take 1 tablet (60 mg total) by mouth 2 (two) times daily. 180 tablet 3   No current facility-administered medications for this visit.   No results found.  Review of Systems:   A ROS was performed including pertinent positives and negatives as documented in the HPI.  Physical Exam :   Constitutional: NAD and appears stated age Neurological: Alert and oriented Psych: Appropriate affect and cooperative There were no vitals taken for this visit.   Comprehensive Musculoskeletal Exam:    Pain about the right proximal tibia.  Braces in place.  She sits in a wheelchair.   Imaging:   Xray (right tibia-fibula 2 views, CT scan right tibia): Nondisplaced proximal tibia fracture oblique    I personally reviewed and interpreted the radiographs.   Assessment and Plan:   68 y.o. female proximal tibia fracture following a fall from standing.  I discussed treatment options at this point.  At this time x-rays are showing some early callus formation.  She will remain nonweightbearing and I will plan to see her back in 3 weeks for  reassessment and possible advancement of weightbearing at that time based on x-rays  -Return to clinic 3 weeks   I personally saw and evaluated the patient, and participated in the management and treatment plan.  Elspeth Parker, MD Attending Physician, Orthopedic Surgery  This document was dictated using Dragon voice recognition software. A reasonable attempt at proof reading has been made to minimize errors.

## 2024-04-08 ENCOUNTER — Other Ambulatory Visit: Payer: Self-pay | Admitting: Cardiology

## 2024-04-20 ENCOUNTER — Ambulatory Visit (HOSPITAL_BASED_OUTPATIENT_CLINIC_OR_DEPARTMENT_OTHER): Admitting: Orthopaedic Surgery

## 2024-04-20 ENCOUNTER — Telehealth (HOSPITAL_BASED_OUTPATIENT_CLINIC_OR_DEPARTMENT_OTHER): Payer: Self-pay | Admitting: Orthopaedic Surgery

## 2024-04-20 ENCOUNTER — Ambulatory Visit (HOSPITAL_BASED_OUTPATIENT_CLINIC_OR_DEPARTMENT_OTHER)

## 2024-04-20 DIAGNOSIS — S82101A Unspecified fracture of upper end of right tibia, initial encounter for closed fracture: Secondary | ICD-10-CM

## 2024-04-20 NOTE — Telephone Encounter (Signed)
 Patient son would like in home therapy until  she is able to get  in for Feb for regular therapy. Best contact [phone] or via [email or web address]

## 2024-04-20 NOTE — Telephone Encounter (Signed)
 Beverley contacted with [facility] to see if a continuation of care is possible

## 2024-04-20 NOTE — Progress Notes (Signed)
 "   Chief Complaint: Right tibia fracture     History of Present Illness:   04/20/2024: Presents today for follow-up.  She has been compliant with nonweightbearing.  Holly Scott is a 69 y.o. female presents 1-1/2 weeks status post right proximal tibia fracture after a fall from standing.  She is here today for CT discussion as well as further discussion.  She has been compliant with nonweightbearing in her brace    PMH/PSH/Family History/Social History/Meds/Allergies:    Past Medical History:  Diagnosis Date   CAD (coronary artery disease), native coronary artery 2021   s/p NSTEMI; cath 2021 with 95% LAD with spasm and thrombus with distal embolization s/p PCI of LAD with residual 30% mid LAD and ramus   Diabetes mellitus without complication (HCC)    HLD (hyperlipidemia)    Hypertension    Past Surgical History:  Procedure Laterality Date   CORONARY STENT INTERVENTION N/A 06/27/2019   Procedure: CORONARY STENT INTERVENTION;  Surgeon: Anner Alm ORN, MD;  Location: [facility] INVASIVE CV LAB;  Service: Cardiovascular;  Laterality: N/A;   LEFT HEART CATH AND CORONARY ANGIOGRAPHY N/A 06/27/2019   Procedure: LEFT HEART CATH AND CORONARY ANGIOGRAPHY;  Surgeon: Anner Alm ORN, MD;  Location: [facility] INVASIVE CV LAB;  Service: Cardiovascular;  Laterality: N/A;   Social History   Socioeconomic History   Marital status: Married    Spouse name: Not on file   Number of children: Not on file   Years of education: Not on file   Highest education level: Not on file  Occupational History   Not on file  Tobacco Use   Smoking status: Never   Smokeless tobacco: Never  Vaping Use   Vaping status: Never Used  Substance and Sexual Activity   Alcohol use: Never   Drug use: Never   Sexual activity: Not on file  Other Topics Concern   Not on file  Social History Narrative   Not on file   Social Drivers of Health   Tobacco Use: Low Risk (02/24/2024)   Patient History    Smoking Tobacco  Use: Never    Smokeless Tobacco Use: Never    Passive Exposure: Not on file  Financial Resource Strain: Not on file  Food Insecurity: Low Risk (05/20/2023)   Received from [facility]   Epic    Within the past 12 months, you worried that your food would run out before you got money to buy more: Never true    Within the past 12 months, the food you bought just didn't last and you didn't have money to get more. : Never true  Transportation Needs: Unmet Transportation Needs (05/20/2023)   Received from [facility]    In the past 12 months, has lack of reliable transportation kept you from medical appointments, meetings, work or from getting things needed for daily living? : Yes  Physical Activity: Not on file  Stress: Not on file  Social Connections: Not on file  Depression (EYV7-0): Not on file  Alcohol Screen: Not on file  Housing: Low Risk (05/20/2023)   Received from [facility]   Epic    What is your living situation today?: I have a steady place to live    Think about the place you live. Do you have problems with any of the following? Choose all that apply:: Not on file  Utilities: Low Risk (05/20/2023)   Received from [facility]   Utilities    In the past 12  months has the electric, gas, oil, or water company threatened to shut off services in your home? : No  Health Literacy: Not on file   Family History  Problem Relation Age of Onset   Stroke Father 59   Heart disease Neg Hx    No Known Allergies Current Outpatient Medications  Medication Sig Dispense Refill   aspirin  EC 81 MG tablet Take 81 mg by mouth daily.     atorvastatin  (LIPITOR ) 80 MG tablet TAKE 1 TABLET(80 MG) BY MOUTH DAILY AT 6 PM 90 tablet 3   BD PEN NEEDLE NANO 2ND GEN 32G X 4 MM MISC Use 1 time daily     BRILINTA  90 MG TABS tablet Take 1 tablet (90 mg total) by mouth 2 (two) times daily. Pt needs to schedule appt with provider for future refills - 1st attempt 60 tablet 0   furosemide   (LASIX ) 20 MG tablet Take 1 tablet (20 mg total) by mouth as needed for fluid or edema. 30 tablet 6   HYDROcodone -acetaminophen  (NORCO/VICODIN) 5-325 MG tablet Take 1 tablet by mouth every 6 (six) hours as needed for moderate pain (pain score 4-6). 20 tablet 0   insulin  degludec (TRESIBA) 100 UNIT/ML FlexTouch Pen Inject 10 Units into the skin daily.     JARDIANCE  10 MG TABS tablet TAKE 1 TABLET BY MOUTH AS DIRECTED 90 tablet 1   metFORMIN (GLUCOPHAGE) 500 MG tablet Take 500 mg by mouth 2 (two) times daily with a meal.     metoprolol  tartrate (LOPRESSOR ) 25 MG tablet Take 1 tablet (25 mg total) by mouth 2 (two) times daily. 180 tablet 3   Misc. Devices (FOLDING WALKER) MISC Use to assist with walking 1 each 0   Misc. Devices (WALKER) MISC Dispense 1 standard walker to assist with ambulation 1 each 0   Multiple Vitamin (QUINTABS) TABS Take 1 tablet by mouth daily.     Multiple Vitamins-Minerals (MULTIVITAMIN WITH MINERALS) tablet Take 1 tablet by mouth daily.     nitroGLYCERIN  (NITROSTAT ) 0.4 MG SL tablet Place 1 tablet (0.4 mg total) under the tongue every 5 (five) minutes x 3 doses as needed for chest pain. 25 tablet 3   pioglitazone (ACTOS) 45 MG tablet Take 45 mg by mouth daily.     potassium chloride  (KLOR-CON  M) 10 MEQ tablet Take one (1) tablet by mouth ( ) daily as needed with Lasix . 30 tablet 6   telmisartan  (MICARDIS ) 40 MG tablet Take 0.5 tablets (20 mg total) by mouth daily. 45 tablet 3   ticagrelor  (BRILINTA ) 60 MG TABS tablet Take 1 tablet (60 mg total) by mouth 2 (two) times daily. 180 tablet 3   traMADol  (ULTRAM ) 50 MG tablet Take 1 tablet (50 mg total) by mouth every 6 (six) hours as needed. 20 tablet 0   No current facility-administered medications for this visit.   No results found.  Review of Systems:   A ROS was performed including pertinent positives and negatives as documented in the HPI.  Physical Exam :   Constitutional: NAD and appears stated age Neurological:  Alert and oriented Psych: Appropriate affect and cooperative There were no vitals taken for this visit.   Comprehensive Musculoskeletal Exam:    Pain about the right proximal tibia.  Braces in place.  She sits in a wheelchair.   Imaging:   Xray (right tibia-fibula 2 views, CT scan right tibia): Nondisplaced proximal tibia fracture oblique    I personally reviewed and interpreted the radiographs.   Assessment  and Plan:   69 y.o. female proximal tibia fracture following a fall from standing.  X-rays today show increasing callus formation.  Given this we will plan to allow her to weight-bear as tolerated.  I do believe she be a candidate for aquatic therapy.  Will plan to transition her to outpatient therapy as well  -Return to clinic 6 weeks for reassessment   I personally saw and evaluated the patient, and participated in the management and treatment plan.  Elspeth Parker, MD Attending Physician, Orthopedic Surgery  This document was dictated using Dragon voice recognition software. A reasonable attempt at proof reading has been made to minimize errors. "

## 2024-04-28 ENCOUNTER — Telehealth: Payer: Self-pay

## 2024-04-28 NOTE — Telephone Encounter (Signed)
 Delon, PT with [facility] would like verbal orders to extend HHPT for 1 x week for 9 wks and clarification on WB status for Right LE.  CB# [phone].  Please Advise.  Thank you.

## 2024-04-29 NOTE — Telephone Encounter (Signed)
 Returned call to patient for verbal orders and informed her she is WBAT as of 1/7

## 2024-05-08 ENCOUNTER — Other Ambulatory Visit: Payer: Self-pay | Admitting: Cardiology

## 2024-05-13 ENCOUNTER — Other Ambulatory Visit: Payer: Self-pay | Admitting: Cardiology

## 2024-05-13 NOTE — Telephone Encounter (Signed)
 In accordance with refill protocols, please review and address the following requirements before this medication refill can be authorized:  Labs

## 2024-06-01 ENCOUNTER — Ambulatory Visit (HOSPITAL_BASED_OUTPATIENT_CLINIC_OR_DEPARTMENT_OTHER): Admitting: Orthopaedic Surgery

## 2024-06-08 ENCOUNTER — Ambulatory Visit (HOSPITAL_BASED_OUTPATIENT_CLINIC_OR_DEPARTMENT_OTHER): Admitting: Physical Therapy

## 2024-06-15 ENCOUNTER — Ambulatory Visit (HOSPITAL_BASED_OUTPATIENT_CLINIC_OR_DEPARTMENT_OTHER): Admitting: Physical Therapy

## 2024-06-22 ENCOUNTER — Ambulatory Visit (HOSPITAL_BASED_OUTPATIENT_CLINIC_OR_DEPARTMENT_OTHER): Admitting: Physical Therapy

## 2024-06-29 ENCOUNTER — Ambulatory Visit (HOSPITAL_BASED_OUTPATIENT_CLINIC_OR_DEPARTMENT_OTHER): Admitting: Physical Therapy

## 2024-07-06 ENCOUNTER — Ambulatory Visit (HOSPITAL_BASED_OUTPATIENT_CLINIC_OR_DEPARTMENT_OTHER): Admitting: Physical Therapy
